# Patient Record
Sex: Female | Born: 1975 | Race: Black or African American | Hispanic: No | Marital: Single | Smoking: Never smoker
Health system: Southern US, Community
[De-identification: ages and names within clinical notes are randomized; demographics above are authoritative.]

## PROBLEM LIST (undated history)

## (undated) ENCOUNTER — Inpatient Hospital Stay (HOSPITAL_COMMUNITY): Payer: Self-pay

## (undated) DIAGNOSIS — I471 Supraventricular tachycardia, unspecified: Secondary | ICD-10-CM

## (undated) DIAGNOSIS — O24419 Gestational diabetes mellitus in pregnancy, unspecified control: Secondary | ICD-10-CM

## (undated) DIAGNOSIS — G43909 Migraine, unspecified, not intractable, without status migrainosus: Secondary | ICD-10-CM

## (undated) DIAGNOSIS — D649 Anemia, unspecified: Secondary | ICD-10-CM

---
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
## 2001-08-18 ENCOUNTER — Other Ambulatory Visit: Admission: RE | Admit: 2001-08-18 | Discharge: 2001-08-18 | Payer: Self-pay | Admitting: *Deleted

## 2007-09-16 ENCOUNTER — Ambulatory Visit (HOSPITAL_COMMUNITY): Admission: RE | Admit: 2007-09-16 | Discharge: 2007-09-16 | Payer: Self-pay | Admitting: Obstetrics & Gynecology

## 2007-09-23 ENCOUNTER — Inpatient Hospital Stay (HOSPITAL_COMMUNITY): Admission: AD | Admit: 2007-09-23 | Discharge: 2007-09-23 | Payer: Self-pay | Admitting: Obstetrics

## 2007-10-15 ENCOUNTER — Emergency Department (HOSPITAL_COMMUNITY): Admission: EM | Admit: 2007-10-15 | Discharge: 2007-10-15 | Payer: Self-pay | Admitting: Emergency Medicine

## 2007-10-28 ENCOUNTER — Ambulatory Visit (HOSPITAL_COMMUNITY): Admission: RE | Admit: 2007-10-28 | Discharge: 2007-10-28 | Payer: Self-pay | Admitting: Obstetrics & Gynecology

## 2007-11-18 ENCOUNTER — Ambulatory Visit: Payer: Self-pay | Admitting: Cardiovascular Disease

## 2007-12-02 ENCOUNTER — Ambulatory Visit: Payer: Self-pay

## 2007-12-02 ENCOUNTER — Encounter: Payer: Self-pay | Admitting: Cardiovascular Disease

## 2008-01-03 ENCOUNTER — Ambulatory Visit (HOSPITAL_COMMUNITY): Admission: RE | Admit: 2008-01-03 | Discharge: 2008-01-03 | Payer: Self-pay | Admitting: Obstetrics & Gynecology

## 2008-01-31 ENCOUNTER — Ambulatory Visit: Payer: Self-pay | Admitting: Cardiovascular Disease

## 2008-02-11 ENCOUNTER — Inpatient Hospital Stay (HOSPITAL_COMMUNITY): Admission: AD | Admit: 2008-02-11 | Discharge: 2008-02-11 | Payer: Self-pay | Admitting: Obstetrics & Gynecology

## 2008-02-25 ENCOUNTER — Inpatient Hospital Stay (HOSPITAL_COMMUNITY): Admission: RE | Admit: 2008-02-25 | Discharge: 2008-02-27 | Payer: Self-pay | Admitting: Obstetrics & Gynecology

## 2008-02-25 ENCOUNTER — Encounter: Payer: Self-pay | Admitting: Obstetrics & Gynecology

## 2008-12-05 IMAGING — US US FETAL BPP W/O NONSTRESS
1 series · 4 of 4 positions shown · non-contrast
Comparison: none

OBSTETRICAL ULTRASOUND:
 This ultrasound exam was performed in the [HOSPITAL] Ultrasound Department.  The OB US report was generated in the AS system, and faxed to the ordering physician.  This report is also available in [REDACTED] PACS.

[Series 1: us fetal bpp w/o nonstress · 4 acquisitions, 4 frames shown]
[im 1/4]
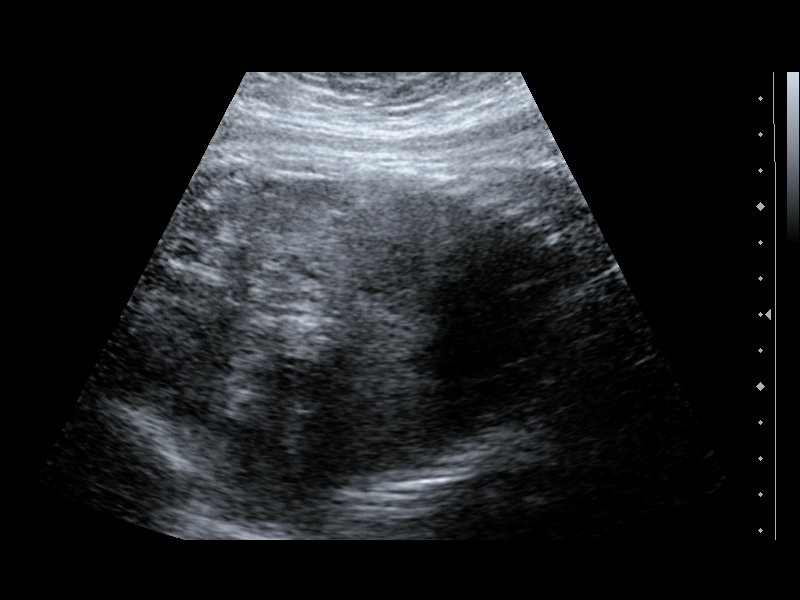
[im 2/4]
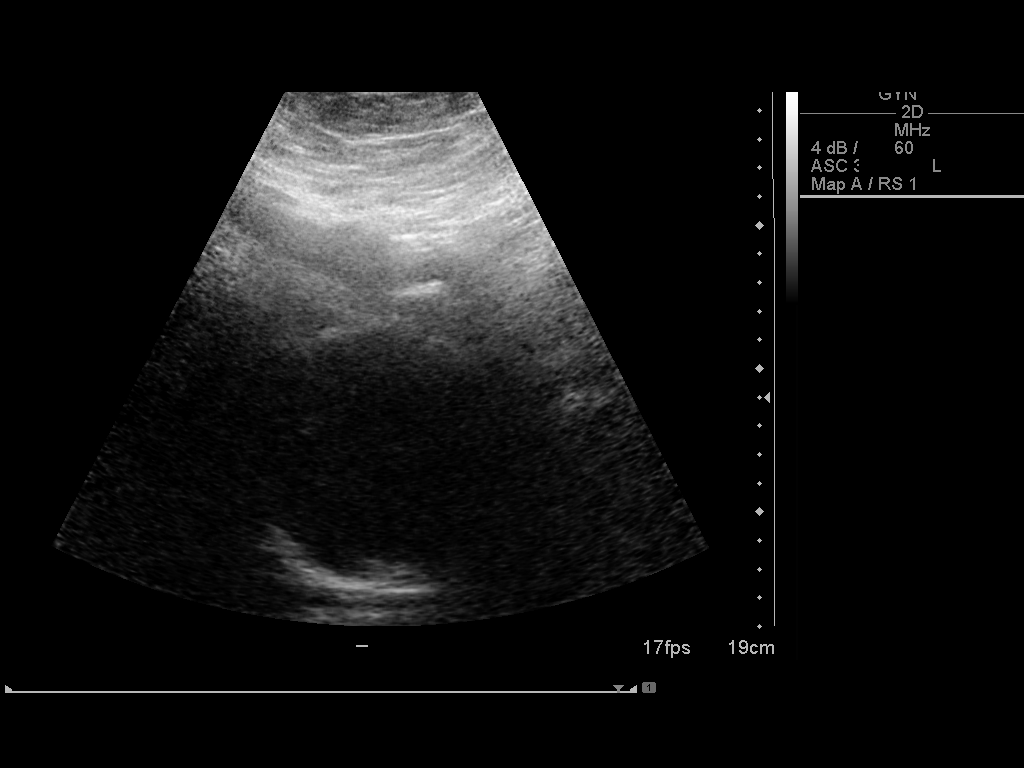
[im 3/4]
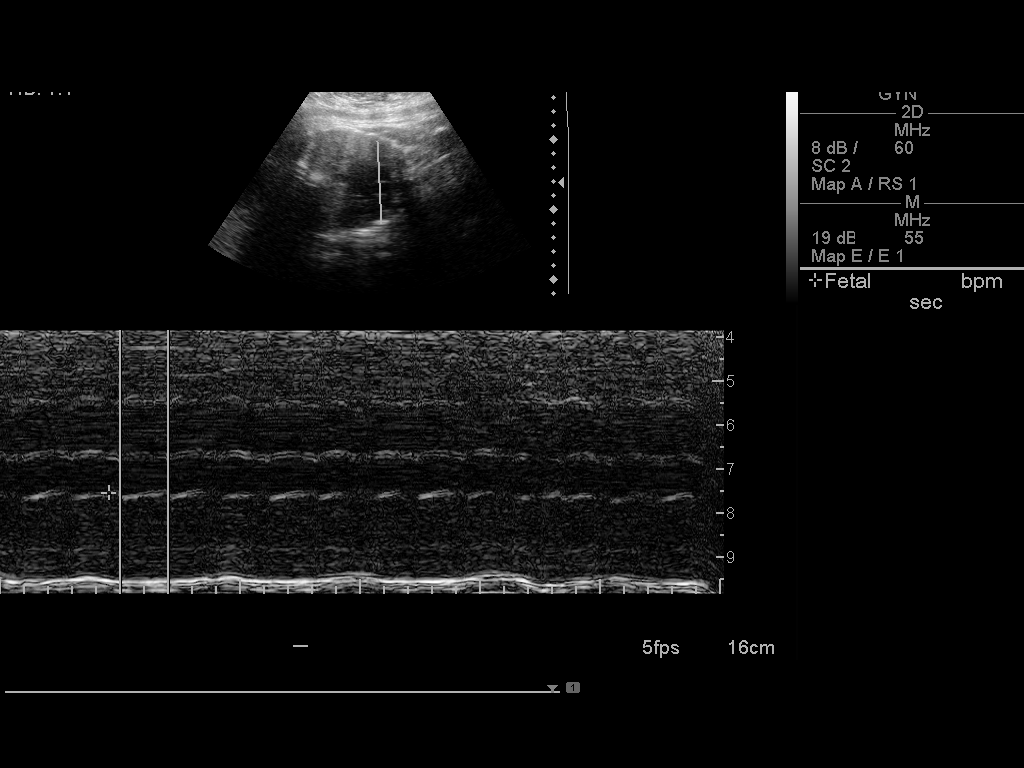
[im 4/4]
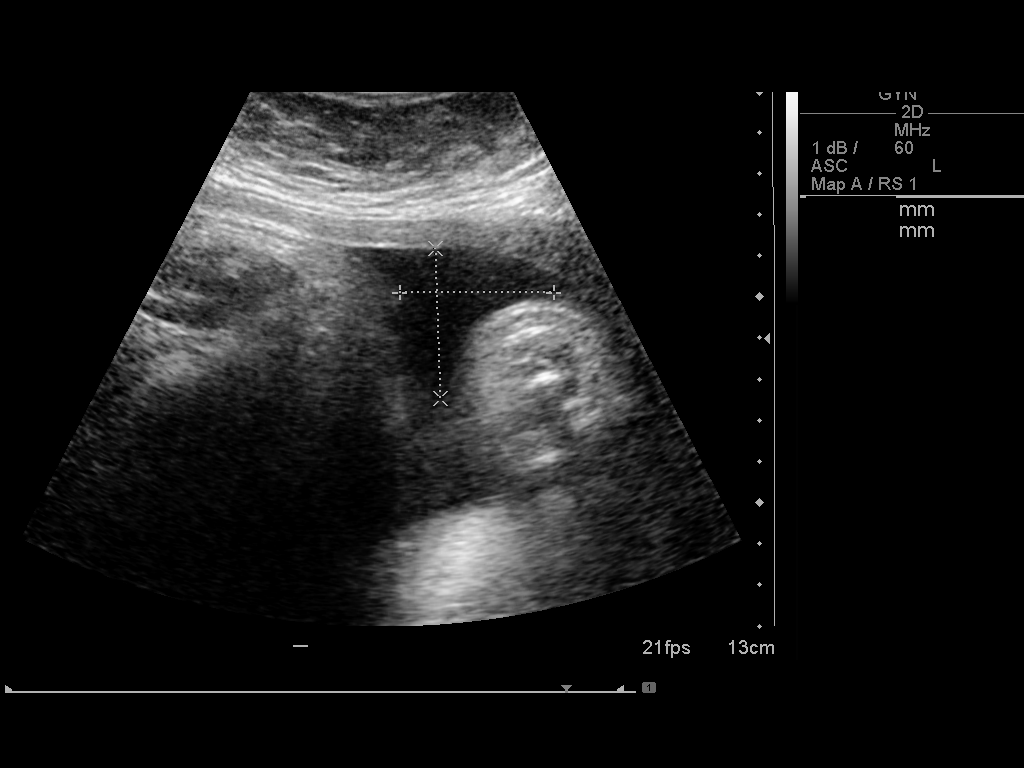

[4 of 4 positions shown; findings below may reference images not displayed]

IMPRESSION: See AS Obstetric US report.

## 2009-09-11 ENCOUNTER — Encounter: Admission: RE | Admit: 2009-09-11 | Discharge: 2009-09-11 | Payer: Self-pay | Admitting: Obstetrics & Gynecology

## 2010-09-17 NOTE — Assessment & Plan Note (Signed)
[facility]                            CARDIOLOGY OFFICE NOTE   Mary Fry, Mary Fry                    MRN:          [id number]  DATE:01/31/2008                            DOB:          [birth year]    Mary Fry returns today for followup.  She has been doing well.  There  is a questionable history of PSVT and palpitations.  She is due to have  a C-section in November and this will be her third child.  The first 2  were C-sections.  Her palpitations have resolved.  She did not have any  need for an event monitor.  Her 2-D echocardiogram was normal with an EF  of 60-65%.  No evidence cardiomyopathy.   She does not recount what her current hemoglobin or crit is but  apparently, she has significant anemia.   In talking to the patient, she feels that she is doing well.  She has  actually lost a little bit of weight during her pregnancy.  She has had  an ultrasound.  She is having a healthy baby girl and she will name the  baby [place].   Review of systems otherwise negative.  In particular, she is not having  any nausea, vomiting, palpitations, chest pain.  She has mild chronic  exertional dyspnea due to her weight.   She is only taking prenatal vitamins.   She has no known allergies.   PHYSICAL EXAMINATION:  GENERAL:  Remarkable for morbidly obese black  female who is in a good mood.  VITAL SIGNS:  Weight is 363, blood pressure is 110/70, pulse 80 and  regular, respiratory rate 14, afebrile.  HEENT:  Unremarkable.  Carotids normal without bruit.  No  lymphadenopathy, thyromegaly, or JVP elevation.  LUNGS:  Clear  diaphragmatic motion.  No wheezing.  ABDOMEN:  Protuberant and difficult to examine due to her obesity.  No  fetal motion detected.  No hepatosplenomegaly.  No hepatojugular reflux.  No tenderness.  EXTREMITIES:  Distal pulses intact.  Trace edema.  NEURO:  Nonfocal.  SKIN:  Warm and dry.  No muscular weakness.   IMPRESSION:  1.  Palpitations, benign.  No indication for beta blocker.  Continue to      monitor.  2. Dyspnea.  Functional, secondary to central obesity in a pregnancy      state.  3. History of anemia.  We will probably check this prior to delivery.  4. Gravida 3, para 3 healthy pregnancy.  Follow up with Dr. Jean Rosenthal,      elective C-section in November.  We will see her on an as-needed      basis.     Noralyn Pick. Eden Emms, MD, [facility]  Electronically Signed    PCN/MedQ  DD: 01/31/2008  DT: 02/01/2008  Job #: [id number]   cc:   Roseanna Rainbow, M.D.

## 2010-09-17 NOTE — Discharge Summary (Signed)
Mary Fry, Mary Fry           ACCOUNT NO.:  [id number]   MEDICAL RECORD NO.:  [id number]          PATIENT TYPE:  INP   LOCATION:  9146                          FACILITY:  [facility]   PHYSICIAN:  Charles A. Clearance Coots, M.D.DATE OF BIRTH:  10-20-1975   DATE OF ADMISSION:  02/25/2008  DATE OF DISCHARGE:  02/27/2008                               DISCHARGE SUMMARY   ADMITTING DIAGNOSES:  1. Multipara at term.  2. Two previous cesarean sections.  3. Morbid obesity.  4. Gestational diabetes, diet controlled.  5. Elective repeat cesarean section.   DISCHARGE DIAGNOSES:  1. Multipara at term.  2. Two previous cesarean sections.  3. Morbid obesity.  4. Gestational diabetes, diet controlled.  5. Status post elective repeat low-transverse cesarean on February 25, 2008.  Viable female delivered at 14:02, Apgars of 1 at 1 minute      and 8 at 5 minutes, weight of 3970 grams, and length of 51.44 cm.      Mother and infant discharged home in good condition.   REASON FOR ADMISSION:  A 35 year old para 2, estimated date of  confinement of March 05, 2008, presents for elective repeat cesarean  section.   PAST MEDICAL HISTORY:  Surgery - cesarean section.  Illnesses - anemia.   MEDICATIONS:  Prenatal vitamins.   ALLERGIES:  No known drug allergies.   SOCIAL HISTORY:  Single.  Negative tobacco, alcohol, or recreational  drug use.   FAMILY HISTORY:  No major illnesses listed.   PHYSICAL EXAMINATION:  GENERAL:  Morbidly obese black female, in no  acute distress.  VITAL SIGNS:  She is afebrile and blood pressure of 127/63.  LUNGS:  Clear to auscultation bilaterally.  HEART:  Regular rate and rhythm.  ABDOMEN:  Gravid and nontender.  CERVICAL:  Omitted.   ADMITTING LABS:  Hemoglobin 10, hematocrit 33, white blood cell count  8200, platelets 221,000, and RPR is nonreactive.   HOSPITAL COURSE:  The patient underwent a repeat low-transverse cesarean  section on February 25, 2008.  There  were no intraoperative  complications.  Postoperative course was uncomplicated and the patient  was discharged home on postop day #2 in good condition.   DISCHARGE LABS:  Hemoglobin 9, hematocrit 28, white blood cell count  8400, and platelets 190,000.   DISCHARGE DISPOSITION:  Medications, Percocet and ibuprofen was  prescribed for pain.  Continue prenatal vitamins.  Instructions were  given for discharge after cesarean section.  The patient is to call the  office for a followup appointment in 1 week after cesarean section for  removal of her staples.      Charles A. Clearance Coots, M.D.  Electronically Signed     CAH/MEDQ  D:  02/27/2008  T:  02/27/2008  Job:  [id number]

## 2010-09-17 NOTE — Op Note (Signed)
Mary Fry, Mary Fry           ACCOUNT NO.:  [id number]   MEDICAL RECORD NO.:  [id number]          PATIENT TYPE:  INP   LOCATION:  9146                          FACILITY:  [facility]   PHYSICIAN:  Roseanna Rainbow, M.D.DATE OF BIRTH:  1975-12-07   DATE OF PROCEDURE:  02/25/2008  DATE OF DISCHARGE:                               OPERATIVE REPORT   PREOPERATIVE DIAGNOSES:  1. Intrauterine pregnancy at 38+ weeks with a history of 2 previous      cesarean deliveries.  2. Gestational diabetes, diet-controlled.  3. Suspected large for gestational age fetus.   POSTOPERATIVE DIAGNOSES:  1. Intrauterine pregnancy at 38+ weeks with a history of 2 previous      cesarean deliveries.  2. Gestational diabetes, diet-controlled.  3. Suspected large for gestational age fetus.   PROCEDURE:  Repeat cesarean delivery via midline skin incision.   SURGEONS:  1. Roseanna Rainbow, MD  2. Kathreen Cosier, MD   ANESTHESIA:  Combined epidural and spinal.   PATHOLOGY:  Placenta.   ESTIMATED BLOOD LOSS:  600 mL.   COMPLICATIONS:  None.   PROCEDURES:  The patient was taken to the operating room.  A combined  spinal and epidural anesthetic was placed.  She was then placed in the  dorsal supine position with a leftward tilt and prepped and draped in  the usual sterile fashion.  After a time-out had been completed, a  midline supraumbilical skin incision was made with a scalpel.  Blunt  dissection was used to dissect down to the fascia.  The fascia was  incised along the length of the incision.  The parietal peritoneum was  tented up and entered.  This incision was then extended along the length  of the incision.  An Alexis retractor was then placed into the incision.  The bowel was packed away with moistened laparotomy packs.  The  vesicouterine peritoneum was tented up and entered sharply.  This  incision was then extended bilaterally and the bladder flap was created  using both sharp and  blunt dissection.  The lower uterine segment was  then incised in a transverse fashion with a scalpel.  This incision was  then extended with bandage scissors.  The infant's head was delivered  atraumatically.  The oropharynx was suctioned with a bulb suction.  The  cord was clamped and cut.  The infant was handed off to the awaiting  neonatologist.  The placenta was then removed.  The intrauterine cavity  was evacuated of any remaining amniotic fluid, clots, and debris with  moist laparotomy sponge.  The uterine incision was then reapproximated  in a running interlocking fashion using suture of 0 Monocryl.  A second  imbricating layer of the same suture was then placed.  Adequate  hemostasis was noted.  The paracolic gutters were then copiously  irrigated.  The packs and retractor were then removed from the incision.  The parietal peritoneum and fascia were then closed as a single layer  using running sutures of #1 PDS.  This subcuticular  layer was reapproximated with a running suture of 2-0 plain.  The skin  was closed with staples.  At the close of the procedure, the instrument  and pack counts were said to be correct x2.  The patient was taken to  the PACU awake and in stable condition.      Roseanna Rainbow, M.D.  Electronically Signed     LAJ/MEDQ  D:  02/25/2008  T:  02/26/2008  Job:  [id number]

## 2010-09-17 NOTE — H&P (Signed)
Mary Fry, HAHNE           ACCOUNT NO.:  [id number]   MEDICAL RECORD NO.:  [id number]          PATIENT TYPE:  INP   LOCATION:                                [facility]  [facility]   PHYSICIAN:  Roseanna Rainbow, M.D.DATE OF BIRTH:  1975-09-12   DATE OF ADMISSION:  02/25/2008  DATE OF DISCHARGE:  02/27/2008                              HISTORY & PHYSICAL   CHIEF COMPLAINT:  The patient is a 35 year old para 2 with an expected  date of confinement of November 1 with an intrauterine pregnancy at 38  weeks 5 days for an elective repeat cesarean delivery.   HISTORY OF PRESENT ILLNESS:  Please see the above.   ALLERGIES:  No known drug allergies.   MEDICATIONS:  Please see the medication reconciliation form.   OB RISK FACTORS:  Morbid obesity.  She has a history of previous  cesarean delivery.  There is a history of gestational diabetes, diet-  controlled.  There is a recent ultrasound with suspected macrosomia and  there were several episodes of tachycardia for which she had seen a  cardiologist, that has resolved.   PAST OBSTETRICAL HISTORY:  In August of 1997, she was delivered of a  live-born female, 8 pounds 1 ounce, full-term, via cesarean delivery.  In  January of 2002, she was delivered of a live-born female, 6 pounds 5  ounces, via repeat cesarean delivery.   PRENATAL LABS:  Blood type O positive, antibody screen negative.  Wound  culture, September 2, positive for Proteus.  Chlamydia probe negative.  Free T4, June 18, normal.  One-hour GCT 146.  Gardnerella vaginalis  positive, May 2009.  Hepatitis B surface antigen negative.  Hematocrit  31.1, hemoglobin 9.7, HIV nonreactive.  Platelets 221,000.  Rubella  immune.  Sickle cell negative.  TSH normal.  Trichomonas vaginalis  positive, May 2009.  Varicella immune.   An  ultrasound on August 31:  There is posterior placenta, normal  amniotic fluid index, estimated fetal weight percentile at greater than  the 90th  percentile.   PAST GYN HISTORY:  Noncontributory.   PAST MEDICAL HISTORY:  Iron deficiency anemia.   PAST SURGICAL HISTORY:  Please see the above.   SOCIAL HISTORY:  She is unemployed, single, does not give any  significant history of alcohol usage.  Has no significant smoking  history.  Denies illicit drug use.   FAMILY HISTORY:  Noncontributory.   PHYSICAL EXAM:  VITAL SIGNS:  Weight 364 pounds, blood pressure 127/63,  fetal heart tracing reassuring on nonstress test.  CBGs in range.  GENERAL:  Obese African American female in no apparent distress.  ABDOMEN:  Gravid, nontender.  PELVIC EXAM:  Deferred.  EXTREMITIES:  Trace to 1+ lower extremity edema.   ASSESSMENT:  Multipara at term with a history of two previous cesarean  deliveries, morbidly obese.  Gestational diabetes, diet-controlled.  Suspected macrosomic fetus.   PLAN:  Admission.  Elective repeat cesarean delivery.      Roseanna Rainbow, M.D.  Electronically Signed     LAJ/MEDQ  D:  02/25/2008  T:  02/25/2008  Job:  [id number]

## 2010-09-17 NOTE — Assessment & Plan Note (Signed)
[facility]                            CARDIOLOGY OFFICE NOTE   Mary Fry, Mary Fry                    MRN:          [id number]  DATE:11/18/2007                            DOB:          1975/07/01    Mary Fry is a 35 year old patient referred by her OB/GYN doctor for  palpitations, question of PSVT.  The patient has had about 5 episodes in  her life where she has had rapid palpitations.  They sound like possible  PSVT with sudden onset of rapid palpitations.  She was seen in the  emergency room on October 15, 2007.   There was no documented SVT, but apparently her heart rate jumped up  into the 160 range.  She generally feels relief with slow breathing.   She can get associated dyspnea with this.  There was no presyncope or  diaphoresis.   She had a couple of short episodes after her ER visit, but has not had  any since then.   The patient has not had previous history of cardiomyopathy or heart  problems.  There has been no history of rheumatic disease, coronary  artery disease, or congenital heart disease.   She has 2 healthy children that I believe are 7 and 11.  She is  currently 13 weeks' pregnant with a due date in November.   She has significant central obesity and has always been heavy.   The patient is currently doing well with her pregnancy.  She is having a  baby girl and as far as she knows there is no other problems.   Her review of systems is otherwise negative.   Her past medical history is remarkable for 2 previous C-sections.  There  is a history of an anemia.  It appears that her hematocrit was as low as  31 on her ER visit.  She tells me her thyroid has recently been tested  and is normal.   The patient is single.  She has 2 living children and a third on the  way.  She does not smoke or drink.  She denies drugs.  She is divorced.   She is otherwise fairly sedentary and unemployed.   Her family history is  unremarkable.  Mother and father are still alive.   Her meds only include prenatal vitamins.  She has no known allergies.   PHYSICAL EXAMINATION:  GENERAL:  Her exam is remarkable for a morbidly  obese black female who is pregnant.  Affect is appropriate.  VITAL SIGNS:  Weight is 367.  Current heart rate is 90, blood pressure  is 130/80, afebrile, and respiratory rate 16.  HEENT:  Unremarkable.  Carotids normal without bruits.  No  lymphadenopathy, thyromegaly, or JVP elevation.  LUNGS:  Clear, good diaphragmatic motion.  No wheezing.  HEART:  S1, S2.  PMI not palpable.  ABDOMEN:  Protuberant consistent with gestational age and central  obesity.  No tenderness.  No hepatosplenomegaly.  No hepatojugular  reflux.  No bruits.  EXTREMITIES:  Distal pulses are intact.  Trace edema.  NEURO:  Nonfocal.  SKIN:  Warn and dry.  No muscular weakness.   EKG is normal.   IMPRESSION:  1. The patient may have occasional paroxysmal supraventricular      tachycardia.  I talked to her at length about the options.  We      prefer not to start pregnant women on beta-blockers for the      duration of the pregnancy due to the possible effect on fetal heart      rate and also possible hypoglycemia at the time of delivery.  She      is just as likely not to have any recurrent episodes.  I told her,      if she were, to call us, and we will give her an event monitor and      possibly p.r.n. Inderal to take as a short-acting agent.  She will      have a 2-D echocardiogram to further assess her heart and make sure      that there is no structural abnormalities or cardiomyopathy.  2. Anemia.  Follow with Dr. Jean Rosenthal.  I suspect this also increases      her adrenergic tone and tends to make her run more tachycardic.      Consider addition of iron.  3. Central obesity.  Again, the combination of central obesity and      anemia in the state of pregnancy will have a run relatively high      heart rate.   Clearly, the patient needs to do something about her      weight after her delivery.  For the time being, her weight will be      monitored closely during her pregnancy to try to avoid excessive      weight gain.   As long as the patient's echo shows normal RV and LV function, I will  probably see her back in September prior to her due date.  She will call  us, if she has any more palpitations to get an event monitor.     Noralyn Pick. Eden Emms, MD, [facility]  Electronically Signed    PCN/MedQ  DD: 11/18/2007  DT: 11/19/2007  Job #: [id number]   cc:   Roseanna Rainbow, M.D.

## 2011-01-03 ENCOUNTER — Ambulatory Visit (HOSPITAL_COMMUNITY)
Admission: RE | Admit: 2011-01-03 | Discharge: 2011-01-03 | Disposition: A | Discharge status: Home / Self Care | Payer: Medicaid Other | Source: Ambulatory Visit | Attending: Obstetrics & Gynecology | Admitting: Obstetrics & Gynecology

## 2011-01-03 ENCOUNTER — Other Ambulatory Visit: Payer: Self-pay | Admitting: Obstetrics & Gynecology

## 2011-01-03 ENCOUNTER — Encounter (HOSPITAL_COMMUNITY): Payer: Self-pay

## 2011-01-03 VITALS — BP 138/83 | HR 82 | Wt 363.0 lb

## 2011-01-03 DIAGNOSIS — Z3682 Encounter for antenatal screening for nuchal translucency: Secondary | ICD-10-CM

## 2011-01-03 DIAGNOSIS — O99210 Obesity complicating pregnancy, unspecified trimester: Secondary | ICD-10-CM | POA: Insufficient documentation

## 2011-01-03 DIAGNOSIS — O34219 Maternal care for unspecified type scar from previous cesarean delivery: Secondary | ICD-10-CM | POA: Insufficient documentation

## 2011-01-03 DIAGNOSIS — E669 Obesity, unspecified: Secondary | ICD-10-CM | POA: Insufficient documentation

## 2011-01-03 DIAGNOSIS — O09529 Supervision of elderly multigravida, unspecified trimester: Secondary | ICD-10-CM | POA: Insufficient documentation

## 2011-01-03 NOTE — Progress Notes (Signed)
Genetic Counseling  High-Risk Gestation Note  Appointment Date:  01/03/2011 Referred By: Roseanna Rainbow, * Date of Birth:  Aug 29, 1975  Pregnancy History: [id number] Estimated Date of Delivery: 07/10/11 Estimated Gestational Age: [redacted]w[redacted]d  I met with [name] for prenatal genetic counseling given advanced maternal age.   She was counseled regarding maternal age and the association with risk for chromosome conditions due to nondisjunction with aging of the ova.   We reviewed chromosomes, nondisjunction, and the associated 1 in 84 risk for fetal aneuploidy in the first trimester related to a maternal age of 76 at delivery.  She was counseled that the risk for aneuploidy decreases as gestational age increases, accounting for those pregnancies which spontaneously abort.  We specifically discussed Down syndrome (trisomy 82), trisomies 57 and 66, and sex chromosome aneuploidies (47,XXX and 47,XXY) including the common features and prognoses of each.    We reviewed available screening and diagnostic options.  Regarding screening tests, we discussed the options of First screen, Integrated screen and ultrasound.  She understands that screening tests are used to modify a patient's a priori risk for aneuploidy, typically based on age.  This estimate provides a pregnancy specific risk assessment.  We also reviewed the availability of diagnostic options including CVS and amniocentesis. A risk of 1 in 100 was given for CVS and 1 in 200-300 was given for amniocentesis, the primary complication being spontaneous pregnancy loss.  It was discussed that not all birth defects can be identified with these procedures. We discussed the risks, limitations, and benefits of each.  After reviewing these options, Mary Fry elected to have First Screen. However, a nuchal translucency measurement was unable to be obtained today on ultrasound. Given this and given the patient's gestational age, she elected to pursue serum  integrated screening after further consideration. She will return in the second trimester for a second blood draw. She declined CVS and amniocentesis at this time.  She wishes to pursue these options to help ascertain her pregnancy specific risks for aneuploidy. She understands that ultrasound and integrated screen cannot rule out all birth defects or genetic syndromes.  [However, she was counseled that 50-80% of fetuses with Down syndrome and up to 90% of fetuses with trisomies 7 and 5, when well visualized, have detectable anomalies or soft markers by ultrasound.]   Both family histories were reviewed and found to be contributory  for the father of the pregnancy with epilepsy.  The cause is not known. Epilepsy occurs in 1% of the population and can have many causes.  Approximately 80% of epilepsy is thought to be idiopathic while the remaining 20% is secondary to a variety of factors such as perinatal events, infections, trauma and genetic disease.  A specific diagnosis in an affected individual is necessary to accurately assess the risk for other family members to develop epilepsy.  In the absence of a known etiology, epilepsy is thought to be caused by a combination of genetic and environmental factors, called multifactorial inheritance. Recurrence risk for idiopathic epilepsy for offspring of an affected individual is approximately 4%. Without further information regarding the provided family history, an accurate genetic risk cannot be calculated.   Further genetic counseling is warranted if more information is obtained.  She denied exposure to environmental toxins or chemical agents.  She denied the use of alcohol, tobacco or street drugs.  She denied significant viral illnesses during the course of her pregnancy.  Her medical and surgical history were noncontributory.   A complete  obstetrical ultrasound was performed at the time of today's evaluation.  The ultrasound report is reported separately.     We counseled the patient for approximately 30 minutes regarding the above risks.     Mary Braun Commodore Bellew, MS, [facility] 01/03/2011

## 2011-01-03 NOTE — Progress Notes (Signed)
Ultrasound in AS/OBGYN/EPIC.  Follow up U/S scheduled 

## 2011-01-29 LAB — COMPREHENSIVE METABOLIC PANEL
ALT: 24
AST: 40 — ABNORMAL HIGH
Albumin: 2.9 — ABNORMAL LOW
BUN: 4 — ABNORMAL LOW
GFR calc Af Amer: >60
Potassium: 3.8
Total Protein: 6.6

## 2011-01-29 LAB — URINALYSIS, ROUTINE W REFLEX MICROSCOPIC
Bilirubin Urine: NEGATIVE
Nitrite: NEGATIVE
Urobilinogen, UA: 0.2
pH: 8.5 — ABNORMAL HIGH

## 2011-01-29 LAB — CBC
MCHC: 32.8
Platelets: 228
RBC: 4.19
WBC: 8.7

## 2011-01-29 LAB — URINE MICROSCOPIC-ADD ON

## 2011-01-29 LAB — URINE CULTURE

## 2011-01-30 LAB — DIFFERENTIAL
Basophils Relative: 0
Eosinophils Absolute: 0.2
Eosinophils Relative: 2
Lymphocytes Relative: 21
Monocytes Absolute: 0.4
Monocytes Relative: 4
Neutro Abs: 7.2
Neutrophils Relative %: 73

## 2011-01-30 LAB — RAPID URINE DRUG SCREEN, HOSP PERFORMED
Amphetamines: NOT DETECTED
Barbiturates: NOT DETECTED
Tetrahydrocannabinol: NOT DETECTED

## 2011-01-30 LAB — POCT I-STAT, CHEM 8
BUN: 5 — ABNORMAL LOW
Calcium, Ion: 1.23
Creatinine, Ser: 0.7
Potassium: 3.7

## 2011-01-30 LAB — URINALYSIS, ROUTINE W REFLEX MICROSCOPIC
Bilirubin Urine: NEGATIVE
Ketones, ur: NEGATIVE
Nitrite: NEGATIVE
Specific Gravity, Urine: 1.011
Urobilinogen, UA: 0.2

## 2011-01-30 LAB — CBC
Platelets: 232
RBC: 4.2

## 2011-01-30 LAB — POCT CARDIAC MARKERS
Operator id: 161631
Troponin i, poc: <0.05

## 2011-02-03 LAB — BASIC METABOLIC PANEL
CO2: 20
Calcium: 9.2
Glucose, Bld: 96

## 2011-02-03 LAB — CBC
HCT: 28.2 — ABNORMAL LOW
HCT: 33.8 — ABNORMAL LOW
Hemoglobin: 9.1 — ABNORMAL LOW
MCHC: 32.2
Platelets: 221
RBC: 3.7 — ABNORMAL LOW
RDW: 15.4
WBC: 8.2

## 2011-02-03 LAB — TYPE AND SCREEN
ABO/RH(D): O POS
Antibody Screen: NEGATIVE

## 2011-02-03 LAB — ABO/RH: ABO/RH(D): O POS

## 2011-02-05 ENCOUNTER — Ambulatory Visit (HOSPITAL_COMMUNITY)
Admission: RE | Admit: 2011-02-05 | Discharge: 2011-02-05 | Disposition: A | Discharge status: Home / Self Care | Payer: Medicaid Other | Source: Ambulatory Visit | Attending: Obstetrics | Admitting: Obstetrics

## 2011-02-05 ENCOUNTER — Ambulatory Visit (HOSPITAL_COMMUNITY)
Admission: RE | Admit: 2011-02-05 | Discharge: 2011-02-05 | Disposition: A | Discharge status: Home / Self Care | Payer: Medicaid Other | Source: Ambulatory Visit | Attending: Obstetrics & Gynecology | Admitting: Obstetrics & Gynecology

## 2011-02-05 VITALS — BP 119/80 | HR 113 | Wt 372.0 lb

## 2011-02-05 DIAGNOSIS — O358XX0 Maternal care for other (suspected) fetal abnormality and damage, not applicable or unspecified: Secondary | ICD-10-CM | POA: Insufficient documentation

## 2011-02-05 DIAGNOSIS — O99210 Obesity complicating pregnancy, unspecified trimester: Secondary | ICD-10-CM | POA: Insufficient documentation

## 2011-02-05 DIAGNOSIS — O09529 Supervision of elderly multigravida, unspecified trimester: Secondary | ICD-10-CM | POA: Insufficient documentation

## 2011-02-05 DIAGNOSIS — O34219 Maternal care for unspecified type scar from previous cesarean delivery: Secondary | ICD-10-CM | POA: Insufficient documentation

## 2011-02-05 DIAGNOSIS — Z363 Encounter for antenatal screening for malformations: Secondary | ICD-10-CM | POA: Insufficient documentation

## 2011-02-05 DIAGNOSIS — E669 Obesity, unspecified: Secondary | ICD-10-CM | POA: Insufficient documentation

## 2011-02-05 DIAGNOSIS — Z3682 Encounter for antenatal screening for nuchal translucency: Secondary | ICD-10-CM

## 2011-02-05 DIAGNOSIS — Z1389 Encounter for screening for other disorder: Secondary | ICD-10-CM | POA: Insufficient documentation

## 2011-02-05 NOTE — Progress Notes (Signed)
Ultrasound in AS/OBGYN/EPIC.  Follow up U/S scheduled 

## 2011-02-07 ENCOUNTER — Other Ambulatory Visit: Payer: Self-pay | Admitting: Obstetrics and Gynecology

## 2011-02-10 ENCOUNTER — Telehealth (HOSPITAL_COMMUNITY): Payer: Self-pay | Admitting: MS"

## 2011-02-10 NOTE — Telephone Encounter (Signed)
Serum Integrated screen increased risk for Down syndrome from patient's age related risk of 1 in 160 to 1 in 39 (3.8%). Discussed these results with patient including 3.8% chance that Down syndrome is in pregnancy and >96% chance that baby does not have Down syndrome according to these results. Reviewed screening option of targeted ultrasound. Patient has follow-up appointment on 10/24. Also reviewed diagnostic option of amniocentesis including risks, benefits, and limitations. Patient stated that she is leaning towards amniocentesis but needs to talk it over further with the father of the pregnancy. She will call us back should she elect to proceed with amniocentesis.

## 2011-02-26 ENCOUNTER — Ambulatory Visit (HOSPITAL_COMMUNITY)
Admission: RE | Admit: 2011-02-26 | Discharge: 2011-02-26 | Disposition: A | Discharge status: Home / Self Care | Payer: Medicaid Other | Source: Ambulatory Visit | Attending: Obstetrics & Gynecology | Admitting: Obstetrics & Gynecology

## 2011-02-26 DIAGNOSIS — O09529 Supervision of elderly multigravida, unspecified trimester: Secondary | ICD-10-CM | POA: Insufficient documentation

## 2011-02-26 DIAGNOSIS — Z3682 Encounter for antenatal screening for nuchal translucency: Secondary | ICD-10-CM

## 2011-02-26 DIAGNOSIS — E669 Obesity, unspecified: Secondary | ICD-10-CM | POA: Insufficient documentation

## 2011-02-26 DIAGNOSIS — O34219 Maternal care for unspecified type scar from previous cesarean delivery: Secondary | ICD-10-CM | POA: Insufficient documentation

## 2011-02-26 DIAGNOSIS — O99210 Obesity complicating pregnancy, unspecified trimester: Secondary | ICD-10-CM | POA: Insufficient documentation

## 2011-02-26 NOTE — Progress Notes (Signed)
See ultrasound report in AS  

## 2011-04-02 LAB — RPR: RPR: NONREACTIVE

## 2011-04-09 ENCOUNTER — Ambulatory Visit (HOSPITAL_COMMUNITY)
Admission: RE | Admit: 2011-04-09 | Discharge: 2011-04-09 | Disposition: A | Discharge status: Home / Self Care | Payer: Medicaid Other | Source: Ambulatory Visit | Attending: Obstetrics & Gynecology | Admitting: Obstetrics & Gynecology

## 2011-04-09 DIAGNOSIS — O09529 Supervision of elderly multigravida, unspecified trimester: Secondary | ICD-10-CM | POA: Insufficient documentation

## 2011-04-09 DIAGNOSIS — O34219 Maternal care for unspecified type scar from previous cesarean delivery: Secondary | ICD-10-CM | POA: Insufficient documentation

## 2011-04-09 DIAGNOSIS — Z3682 Encounter for antenatal screening for nuchal translucency: Secondary | ICD-10-CM

## 2011-04-09 DIAGNOSIS — E669 Obesity, unspecified: Secondary | ICD-10-CM | POA: Insufficient documentation

## 2011-04-09 DIAGNOSIS — O99210 Obesity complicating pregnancy, unspecified trimester: Secondary | ICD-10-CM | POA: Insufficient documentation

## 2011-05-02 ENCOUNTER — Other Ambulatory Visit: Payer: Self-pay | Admitting: Obstetrics & Gynecology

## 2011-05-02 DIAGNOSIS — O09529 Supervision of elderly multigravida, unspecified trimester: Secondary | ICD-10-CM

## 2011-05-02 DIAGNOSIS — O34219 Maternal care for unspecified type scar from previous cesarean delivery: Secondary | ICD-10-CM

## 2011-05-09 ENCOUNTER — Ambulatory Visit (HOSPITAL_COMMUNITY)
Admission: RE | Admit: 2011-05-09 | Discharge: 2011-05-09 | Disposition: A | Discharge status: Home / Self Care | Payer: Medicaid Other | Source: Ambulatory Visit | Attending: Obstetrics & Gynecology | Admitting: Obstetrics & Gynecology

## 2011-05-09 DIAGNOSIS — E669 Obesity, unspecified: Secondary | ICD-10-CM | POA: Insufficient documentation

## 2011-05-09 DIAGNOSIS — O09529 Supervision of elderly multigravida, unspecified trimester: Secondary | ICD-10-CM | POA: Insufficient documentation

## 2011-05-09 DIAGNOSIS — O34219 Maternal care for unspecified type scar from previous cesarean delivery: Secondary | ICD-10-CM

## 2011-05-21 ENCOUNTER — Other Ambulatory Visit: Payer: Self-pay | Admitting: Obstetrics & Gynecology

## 2011-05-23 ENCOUNTER — Other Ambulatory Visit: Payer: Self-pay | Admitting: Obstetrics & Gynecology

## 2011-05-23 ENCOUNTER — Ambulatory Visit (HOSPITAL_COMMUNITY)
Admission: RE | Admit: 2011-05-23 | Discharge: 2011-05-23 | Disposition: A | Discharge status: Home / Self Care | Payer: Medicaid Other | Source: Ambulatory Visit | Attending: Obstetrics & Gynecology | Admitting: Obstetrics & Gynecology

## 2011-05-23 DIAGNOSIS — O09529 Supervision of elderly multigravida, unspecified trimester: Secondary | ICD-10-CM | POA: Insufficient documentation

## 2011-05-23 DIAGNOSIS — O288 Other abnormal findings on antenatal screening of mother: Secondary | ICD-10-CM

## 2011-05-23 DIAGNOSIS — O34219 Maternal care for unspecified type scar from previous cesarean delivery: Secondary | ICD-10-CM | POA: Insufficient documentation

## 2011-05-23 DIAGNOSIS — E669 Obesity, unspecified: Secondary | ICD-10-CM | POA: Insufficient documentation

## 2011-05-27 ENCOUNTER — Encounter (HOSPITAL_COMMUNITY): Payer: Self-pay | Admitting: *Deleted

## 2011-05-27 ENCOUNTER — Other Ambulatory Visit: Payer: Self-pay

## 2011-05-27 ENCOUNTER — Inpatient Hospital Stay (HOSPITAL_COMMUNITY)
Admission: EM | Admit: 2011-05-27 | Discharge: 2011-05-28 | DRG: 781 | Disposition: A | Discharge status: Home / Self Care | Payer: Medicaid Other | Attending: Obstetrics | Admitting: Obstetrics

## 2011-05-27 DIAGNOSIS — O4700 False labor before 37 completed weeks of gestation, unspecified trimester: Secondary | ICD-10-CM | POA: Diagnosis present

## 2011-05-27 DIAGNOSIS — I471 Supraventricular tachycardia, unspecified: Secondary | ICD-10-CM | POA: Diagnosis present

## 2011-05-27 DIAGNOSIS — O99891 Other specified diseases and conditions complicating pregnancy: Principal | ICD-10-CM | POA: Diagnosis present

## 2011-05-27 DIAGNOSIS — R002 Palpitations: Secondary | ICD-10-CM | POA: Diagnosis present

## 2011-05-27 HISTORY — DX: Anemia, unspecified: D64.9

## 2011-05-27 HISTORY — DX: Supraventricular tachycardia: I47.1

## 2011-05-27 HISTORY — DX: Supraventricular tachycardia, unspecified: I47.10

## 2011-05-27 HISTORY — DX: Migraine, unspecified, not intractable, without status migrainosus: G43.909

## 2011-05-27 LAB — DIFFERENTIAL
Eosinophils Absolute: 0.1 10*3/uL (ref 0.0–0.7)
Eosinophils Relative: 1 % (ref 0–5)
Lymphocytes Relative: 24 % (ref 12–46)
Lymphs Abs: 2.5 10*3/uL (ref 0.7–4.0)
Monocytes Absolute: 0.7 10*3/uL (ref 0.1–1.0)

## 2011-05-27 LAB — URINE MICROSCOPIC-ADD ON

## 2011-05-27 LAB — URINALYSIS, ROUTINE W REFLEX MICROSCOPIC
Bilirubin Urine: NEGATIVE
Specific Gravity, Urine: 1.002 — ABNORMAL LOW (ref 1.005–1.030)
Urobilinogen, UA: 0.2 mg/dL (ref 0.0–1.0)

## 2011-05-27 LAB — BASIC METABOLIC PANEL
BUN: 4 mg/dL — ABNORMAL LOW (ref 6–23)
Calcium: 9.7 mg/dL (ref 8.4–10.5)
GFR calc non Af Amer: >90 mL/min (ref >90)
Glucose, Bld: 146 mg/dL — ABNORMAL HIGH (ref 70–99)
Potassium: 3.9 mEq/L (ref 3.5–5.1)

## 2011-05-27 LAB — POCT I-STAT TROPONIN I

## 2011-05-27 LAB — CBC
HCT: 28.4 % — ABNORMAL LOW (ref 36.0–46.0)
MCH: 22.6 pg — ABNORMAL LOW (ref 26.0–34.0)
MCV: 74.7 fL — ABNORMAL LOW (ref 78.0–100.0)
Platelets: 222 10*3/uL (ref 150–400)
RBC: 3.8 MIL/uL — ABNORMAL LOW (ref 3.87–5.11)
RDW: 16.1 % — ABNORMAL HIGH (ref 11.5–15.5)
WBC: 10.4 10*3/uL (ref 4.0–10.5)

## 2011-05-27 MED ORDER — CALCIUM CARBONATE ANTACID 500 MG PO CHEW: 2.0000 | CHEWABLE_TABLET | ORAL | Status: DC | PRN

## 2011-05-27 MED ORDER — DOCUSATE SODIUM 100 MG PO CAPS
100.0000 mg | ORAL_CAPSULE | Freq: Every day | ORAL | Status: DC
  Administered 2011-05-27: 100 mg via ORAL
  Filled 2011-05-27 (×2): qty 1

## 2011-05-27 MED ORDER — ADENOSINE 6 MG/2ML IV SOLN
INTRAVENOUS | Status: AC
  Filled 2011-05-27: qty 6

## 2011-05-27 MED ORDER — ADENOSINE 6 MG/2ML IV SOLN
6.0000 mg | Freq: Once | INTRAVENOUS | Status: AC
  Administered 2011-05-27: 6 mg via INTRAVENOUS

## 2011-05-27 MED ORDER — PRENATAL MULTIVITAMIN CH
1.0000 | ORAL_TABLET | Freq: Every day | ORAL | Status: DC
  Administered 2011-05-27: 1 via ORAL
  Filled 2011-05-27: qty 1

## 2011-05-27 MED ORDER — MAGNESIUM SULFATE 40 G IN LACTATED RINGERS - SIMPLE
2.0000 g/h | INTRAVENOUS | Status: DC
  Filled 2011-05-27: qty 500

## 2011-05-27 MED ORDER — MAGNESIUM SULFATE BOLUS VIA INFUSION
4.0000 g | Freq: Once | INTRAVENOUS | Status: AC
  Administered 2011-05-27: 4 g via INTRAVENOUS
  Filled 2011-05-27: qty 500

## 2011-05-27 MED ORDER — LACTATED RINGERS IV SOLN
INTRAVENOUS | Status: DC
  Administered 2011-05-27: 09:00:00 via INTRAVENOUS

## 2011-05-27 MED ORDER — ZOLPIDEM TARTRATE 10 MG PO TABS
10.0000 mg | ORAL_TABLET | Freq: Every evening | ORAL | Status: DC | PRN
  Administered 2011-05-27: 10 mg via ORAL
  Filled 2011-05-27: qty 1

## 2011-05-27 MED ORDER — ONDANSETRON HCL 4 MG/2ML IJ SOLN
INTRAMUSCULAR | Status: AC
  Administered 2011-05-27: 4 mg
  Filled 2011-05-27: qty 2

## 2011-05-27 MED ORDER — ACETAMINOPHEN 325 MG PO TABS
650.0000 mg | ORAL_TABLET | ORAL | Status: DC | PRN
  Administered 2011-05-27 (×2): 650 mg via ORAL
  Filled 2011-05-27 (×2): qty 2

## 2011-05-27 NOTE — Progress Notes (Signed)
Pt removed from EFM/toco for transport to [facility] AICU 371 via care Link.  FHT 155, moderate variability, 10 x 10 accels.  UCs q 5 min, mild upon palpation with adequate resting tone.  VSS.  Report called to AICU at [facility].  Report given to [facility] at [facility].  Pt in stable condition.

## 2011-05-27 NOTE — Progress Notes (Signed)
Patient ID: Mary Fry, female   DOB: [birth year], 36 y.o.   MRN: [id number] S: Preterm labor symptoms: UC's  O: Blood pressure 125/72, pulse 100, temperature 98.4 F (36.9 C), temperature source Oral, resp. rate 20, height 5\' 8"  (1.727 m), weight 170.099 kg (375 lb), last menstrual period 10/14/2010, SpO2 100.00%.   WUJ:WJXBJYNW: 150 bpm Toco: irregular, mild UC's GNF:AOZHYQMV:  (unable to assess d/t posterior nature) Effacement (%): 50 Cervical Position: Posterior Station: Ballotable Presentation: Undeterminable Exam by:: Winifred Olive  A/P- 36 y.o. admitted with chest palpitations and UC's. Preterm labor management: bedrest advised and Magnesium Sulfate tocolysis initiated.  Adenosine cardioversion done by Cardiology for SVT at [facility] ER. Dating:  [redacted]w[redacted]d PNL Needed:  none FWB:  Good  PTL:  stable

## 2011-05-27 NOTE — ED Notes (Signed)
Woke up about 4 this AM with her heart racing and SOB.  BP 135/90 P 150-160.  Denies Pain discomfort in chest due to the heart rate and increased pain with exertion

## 2011-05-27 NOTE — H&P (Signed)
Mary Fry is a 36 y.o. female presenting for chest discomfort and UC's.. Maternal Medical History:  Reason for admission: Reason for admission: contractions.  Contractions: Onset was 3-5 hours ago.   Frequency: regular.   Duration is approximately 5 minutes.   Perceived severity is mild.    Fetal activity: Perceived fetal activity is normal.   Last perceived fetal movement was within the past hour.    Prenatal complications: no prenatal complications Polyhydramnios: chest discomfort.     OB History    Grav Para Term Preterm Abortions TAB SAB Ect Mult Living   4 3 3  0 0 0 0 0 0 3     Past Medical History  Diagnosis Date  . PSVT (paroxysmal supraventricular tachycardia)   . Anemia   . Migraines   . Gestational diabetes     with third pregnancy  . SVT (supraventricular tachycardia)     Adenosine caroversion 05/27/11, previous episode 2010   Past Surgical History  Procedure Date  . Cesarean section     x 3 at term  . Cystectomy 1990's    bilateral underarms   Family History: family history is not on file. Social History:  reports that she has never smoked. She does not have any smokeless tobacco history on file. She reports that she does not drink alcohol or use illicit drugs.  Review of Systems  Cardiovascular: Positive for palpitations.  All other systems reviewed and are negative.    Dilation: Fingertip Effacement (%): 50 Station: -3 Exam by:: Alla German RN Blood pressure 111/70, pulse 118, temperature 98.8 F (37.1 C), temperature source Oral, resp. rate 24, height 5\' 8"  (1.727 m), weight 170.099 kg (375 lb), last menstrual period 10/14/2010, SpO2 99.00%. Maternal Exam:  Uterine Assessment: Contraction strength is mild.  Contraction duration is 1 minute. Contraction frequency is regular.   Abdomen: Patient reports no abdominal tenderness.   Physical Exam  Constitutional: She is oriented to person, place, and time. She appears well-developed and  well-nourished.  HENT:  Head: Normocephalic and atraumatic.  Eyes: Conjunctivae and EOM are normal. Pupils are equal, round, and reactive to light.  Neck: Normal range of motion. Neck supple.  Cardiovascular:       Rate 118 bmp.  Respiratory: Effort normal and breath sounds normal.  GI: Soft.  Musculoskeletal: Normal range of motion.  Neurological: She is alert and oriented to person, place, and time. She has normal reflexes.  Skin: Skin is warm and dry.  Psychiatric: She has a normal mood and affect. Her behavior is normal. Judgment and thought content normal.    Prenatal labs: ABO, Rh:   Antibody:   Rubella:   RPR:    HBsAg:    HIV:    GBS:     Assessment/Plan: 33 weeks.  SVT.  Cardioverted at [facility] ER.  UC's.  Transferred to AICU for tocolysis and observation.   Eudora Guevarra A 05/27/2011, 9:23 AM

## 2011-05-27 NOTE — ED Notes (Signed)
Patient c/o nausea  MD aware order recd

## 2011-05-27 NOTE — Progress Notes (Signed)
UR chart review completed.  

## 2011-05-27 NOTE — ED Notes (Signed)
Patient has been having contractions approx every 5 mins Has been on the baby monitor

## 2011-05-27 NOTE — ED Provider Notes (Signed)
History     CSN: [id number]  Arrival date & time 05/27/11  7829   First MD Initiated Contact with Patient 05/27/11 0518      Chief Complaint  Patient presents with  . Tachycardia    (Consider location/radiation/quality/duration/timing/severity/associated sxs/prior treatment) HPI Comments: Patient is a 36 year old female G4 P3 at [redacted] weeks gestation with an uncomplicated pregnancy who presents with a complaint of rapid heartbeat. According to the patient she woke up approximately 4 AM of feeling like her heart was racing and shortness. She states that this has been a persistent feeling, nothing makes it better or worse. She had similar symptoms during a prior pregnancy and was told to hold her breath and air down if this happened again. She tried this at home without improvement. She denies fevers chills cough back pain abdominal pain chest pain swelling in the legs dysuria diarrhea rash or any other complaints of. Prior to going to sleep last night she had no complaints.  She denies alcohol use, drug use, increased caffeine or other stimulant use. Symptoms are persistent, severe, EMS found tachycardic to approximately 160 on a rhythm strip, no medication given prior to arrival as the patient was hemodynamically stable  The history is provided by the patient, the EMS personnel and medical records.    Past Medical History  Diagnosis Date  . PSVT (paroxysmal supraventricular tachycardia)     History reviewed. No pertinent past surgical history.  History reviewed. No pertinent family history.  History  Substance Use Topics  . Smoking status: Never Smoker   . Smokeless tobacco: Not on file  . Alcohol Use: No    OB History    Grav Para Term Preterm Abortions TAB SAB Ect Mult Living   4 3 3  0 0 0 0 0 0 3      Review of Systems  All other systems reviewed and are negative.    Allergies  Review of patient's allergies indicates no known allergies.  Home Medications   Current  Outpatient Rx  Name Route Sig Dispense Refill  . PRENATAL VITAMINS PO Oral Take 1 tablet by mouth daily.       BP 126/57  Pulse 118  Temp(Src) 98.8 F (37.1 C) (Oral)  Resp 13  Ht 5\' 8"  (1.727 m)  Wt 375 lb (170.099 kg)  BMI 57.02 kg/m2  SpO2 99%  LMP 10/14/2010  Physical Exam  Nursing note and vitals reviewed. Constitutional: She appears well-developed and well-nourished. No distress.  HENT:  Head: Normocephalic and atraumatic.  Mouth/Throat: Oropharynx is clear and moist. No oropharyngeal exudate.  Eyes: Conjunctivae and EOM are normal. Pupils are equal, round, and reactive to light. Right eye exhibits no discharge. Left eye exhibits no discharge. No scleral icterus.  Neck: Normal range of motion. Neck supple. No JVD present. No thyromegaly present.  Cardiovascular: Regular rhythm, normal heart sounds and intact distal pulses.  Exam reveals no gallop and no friction rub.   No murmur heard.      Regular tachycardia approximately 170  Pulmonary/Chest: Effort normal and breath sounds normal. No respiratory distress. She has no wheezes. She has no rales.  Abdominal: Soft. Bowel sounds are normal. She exhibits distension ( Gravid abdomen, morbidly obese). She exhibits no mass. There is no tenderness.  Musculoskeletal: Normal range of motion. She exhibits no edema and no tenderness.  Lymphadenopathy:    She has no cervical adenopathy.  Neurological: She is alert. Coordination normal.  Skin: Skin is warm and dry. No rash noted.  No erythema.  Psychiatric: She has a normal mood and affect. Her behavior is normal.    ED Course  Procedures (including critical care time)  Labs Reviewed  CBC - Abnormal; Notable for the following:    RBC 3.80 (*)    Hemoglobin 8.6 (*)    HCT 28.4 (*)    MCV 74.7 (*)    MCH 22.6 (*)    RDW 16.1 (*)    All other components within normal limits  BASIC METABOLIC PANEL - Abnormal; Notable for the following:    CO2 17 (*)    Glucose, Bld 146 (*)     BUN 4 (*)    Creatinine, Ser 0.41 (*)    All other components within normal limits  URINALYSIS, ROUTINE W REFLEX MICROSCOPIC - Abnormal; Notable for the following:    Color, Urine STRAW (*)    Specific Gravity, Urine 1.002 (*)    Hgb urine dipstick TRACE (*)    All other components within normal limits  URINE MICROSCOPIC-ADD ON - Abnormal; Notable for the following:    Squamous Epithelial / LPF FEW (*)    All other components within normal limits  DIFFERENTIAL  POCT I-STAT TROPONIN I  I-STAT TROPONIN I   No results found.   1. Preterm labor   2. PSVT (paroxysmal supraventricular tachycardia)       MDM  Patient appears to be in a AVN RT supraventricular tachycardia. I have discussed her care with the cardiologist on call who is at the bedside to help with resuscitation. I have discussed care with the pharmacist who agrees that adenosine is an appropriate medication in this case. I proceeded with 6 mg IV push of adenosine intravenously with successful conversion from supraventricular tachycardia to a sinus tachycardia. She feels much better, has remained hemodynamically stable with a blood pressure of 140/70.  REview of MEDICAL RECORD NUMBERPt was seen by Dr. Eden Emms in 2009 after having recurrent PSVT - Echo at that time was normal.  At this time the OB/GYN nurse is at the bedside assisting with fetal monitoring and further evaluation at this time.  ED ECG REPORT   Date: 05/27/2011 1610RU  Rate: 166  Rhythm: supraventricular tachycardia (SVT)  QRS Axis: normal  Intervals: normal  ST/T Wave abnormalities: nonspecific T wave changes  Conduction Disutrbances:none  Narrative Interpretation:   Old EKG Reviewed: Compared with EKG from 10/15/2007, sinus tachycardia present at 130 beats per minute  ED ECG REPORT   Date: 05/27/2011 0539 AM  Rate: 132  Rhythm: sinus tachycardia  QRS Axis: normal  Intervals: normal  ST/T Wave abnormalities: normal  Conduction Disutrbances:none   Narrative Interpretation:   Old EKG Reviewed: SVT has resolved, sinus tachycardia has replaced rhythm  The patient has continued to improve as her blood pressure has remained stable and her heart rate has gradually decreased to the point where she currently has a pulse of 105. She is free of any symptoms at this time and has not had a recurrent arrhythmia.  Cardiologist on call this morning requested that the patient have a cardiology followup as an outpatient after delivery of the child for possible electrophysiology studies as she may have a reentrant tachycardia but needs ablation. I discussed this with the patient and she is expressed her understanding.  OB/GYN nurses have evaluated the patient and found that she is in fact in preterm labor with contractions every 2-6 minutes. Due to her size and morbid obesity it is difficult to detect every contractions on  average it is estimated approximately every 5 minutes. Care has been discussed with Dr. Tamela Oddi who is aware of the patient but change of shift at this time will require discussion with Dr. Clearance Coots.  At this time, the pt appears stable for transfer.  Labs show that she has mild anemia - not overly concerning at this stage of pregnancy.  CRITICAL CARE Performed by: Vida Roller   Total critical care time: 35  Critical care time was exclusive of separately billable procedures and treating other patients.  Critical care was necessary to treat or prevent imminent or life-threatening deterioration.  Critical care was time spent personally by me on the following activities: development of treatment plan with patient and/or surrogate as well as nursing, discussions with consultants, evaluation of patient's response to treatment, examination of patient, obtaining history from patient or surrogate, ordering and performing treatments and interventions, ordering and review of laboratory studies, ordering and review of radiographic studies,  pulse oximetry and re-evaluation of patient's condition.    Vida Roller, MD 05/27/11 [phone]

## 2011-05-27 NOTE — ED Notes (Signed)
Nurse in to check cervix  Patient tolerated

## 2011-05-27 NOTE — ED Notes (Signed)
NS bolus infusing.

## 2011-05-27 NOTE — Consult Note (Signed)
Reason for Consult: SVT Referring Physician: Dr Mary Fry is an 36 y.o. female who is currently 7 months pregnant with her second child and has a history of paroxysmal SVT. HPI: Patient woke up at about 4 am this morning with palpitations. She denies chest pain, lightheadedness or shortness of breath. She has no leg edema, no orthopnea or PND.  Cardiac history is significant for palpitations since she was a teenager. They have very abrupt onset and offsets like a light switch. Episodes last for 3 minutes at a time and she holds her breath or bears down to terminate them. Worst episode occurred 3 years ago in pregnancy, she presented to a hospital where she was given a medication that instantaneously terminated the palpitations. She has not had any bad episodes since then. This time around, bearing down did not help. EKG in the ER significant for a regular narrow complex short RP tachycardia at about 167b/m. She received 6 mg of adenosine in the ER with observed termination of SVT into sinus tachycardia which then slowly resolved.  Past Medical History  Diagnosis Date  . PSVT (paroxysmal supraventricular tachycardia)     History reviewed. No pertinent past surgical history.  History reviewed. No pertinent family history.  Social History:  reports that she has never smoked. She does not have any smokeless tobacco history on file. She reports that she does not drink alcohol or use illicit drugs.  Allergies: No Known Allergies  Medications: I have reviewed the patient's current medications.  Results for orders placed during the hospital encounter of 05/27/11 (from the past 48 hour(s))  URINALYSIS, ROUTINE W REFLEX MICROSCOPIC     Status: Abnormal   Collection Time   05/27/11  5:52 AM      Component Value Range Comment   Color, Urine STRAW (*) YELLOW     APPearance CLEAR  CLEAR     Specific Gravity, Urine 1.002 (*) 1.005 - 1.030     pH 7.0  5.0 - 8.0     Glucose, UA  NEGATIVE  NEGATIVE (mg/dL)    Hgb urine dipstick TRACE (*) NEGATIVE     Bilirubin Urine NEGATIVE  NEGATIVE     Ketones, ur NEGATIVE  NEGATIVE (mg/dL)    Protein, ur NEGATIVE  NEGATIVE (mg/dL)    Urobilinogen, UA 0.2  0.0 - 1.0 (mg/dL)    Nitrite NEGATIVE  NEGATIVE     Leukocytes, UA NEGATIVE  NEGATIVE    URINE MICROSCOPIC-ADD ON     Status: Abnormal   Collection Time   05/27/11  5:52 AM      Component Value Range Comment   Squamous Epithelial / LPF FEW (*) RARE     WBC, UA 0-2  <3 (WBC/hpf)    RBC / HPF 0-2  <3 (RBC/hpf)    Bacteria, UA RARE  RARE    POCT I-STAT TROPONIN I     Status: Normal   Collection Time   05/27/11  5:56 AM      Component Value Range Comment   Troponin i, poc 0.00  0.00 - 0.08 (ng/mL)    Comment 3              No results found.  Review of Systems  Constitutional: Negative.   HENT: Negative.   Eyes: Negative.   Respiratory: Negative.   Cardiovascular: Positive for palpitations. Negative for chest pain, orthopnea, claudication, leg swelling and PND.  Gastrointestinal:       ? Contractions  Genitourinary: Negative.  Musculoskeletal: Negative.   Skin: Negative.   Neurological: Negative.   Endo/Heme/Allergies: Negative.   Psychiatric/Behavioral: Negative.    Blood pressure 140/70, pulse 176, temperature 98.4 F (36.9 C), temperature source Oral, resp. rate 24, height 5\' 8"  (1.727 m), weight 375 lb (170.099 kg), last menstrual period 10/14/2010, SpO2 99.00%. Physical Exam  Constitutional: She is oriented to person, place, and time. No distress.       Morbidly Obese  HENT:  Mouth/Throat: No oropharyngeal exudate.  Eyes: No scleral icterus.  Neck: Normal range of motion. No JVD present.  Cardiovascular: Regular rhythm, S1 normal and S2 normal.  Tachycardia present.  PMI is not displaced.  Exam reveals no distant heart sounds and no friction rub.   Murmur heard.  Systolic murmur is present with a grade of 1/6  Respiratory: Effort normal and breath  sounds normal.  GI: She exhibits distension.  Musculoskeletal: Normal range of motion. She exhibits no edema and no tenderness.  Neurological: She is alert and oriented to person, place, and time.  Skin: Skin is warm and dry. No rash noted. No erythema. No pallor.  Psychiatric: Her behavior is normal. Judgment and thought content normal.       Anxious    EKG Regular short RP narrow complex tachycardia at 167b/m. Same terminated into sinus tachycardia with 6mg  of adenosine.  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   Mary Fry 05/27/2011, 6:16 AM

## 2011-05-27 NOTE — ED Notes (Signed)
Patient states she felt like "gas" moving around in her chest.  Also stated she felt pressure in her abd

## 2011-05-27 NOTE — Progress Notes (Signed)
Report received from Hessie Diener, RN.  Arrived to find pt resting comfortably s/p Adenosine cardioversion.  MHR 110-118, regular rhythm, sinus tach.  RR 24.  BP WNL.   O2 sat 97% on RA.  FHTs 155, mod varability, 10  X 10 accels, occasional mild variables.  UCs q 5-7 min, palpating mildelaxed resting tone.  Pt states PS 3/10 with UCs.    G4P3 (3 previous term C/S) at 33.5 GA.   NKDA.  Meds : PNV 1 PO daily.    Surgeries:  C/S x 3, bilateral underarm cystectomies.    Med hx:  Anemia, previous h/o SVT successfuly Adenosine cardioverted with normal cardiac follow up in 2010, migraines, morbid obesity.    Ob hx:  GDM with third pregnancy, failed 1 hour GTT/passed 3 hr GTT this pregnancy, SVT with third pregnancy, SVT current pregnancy successfully cardioverted with Adenosine 05/27/11, LGA this pregnancy per pt.  Dr Clearance Coots contacted via phone.  Pt's presence, status, VS, presenting complaint, SVT with Adenosine cardioversion, UCs, FHT, PS, med/ob hx, prior surgical hx discussed with Dr Clearance Coots, at length.  Orders obtained.  Dr Hyacinth Meeker notified of Dr Verdell Carmine request to transfer pt to [facility].

## 2011-05-28 NOTE — Progress Notes (Signed)
MD made aware of 15x15  X 1 otherwise reassurring FHR.  Per MD leave pt on the monitor until she leaves if no decels, pt may be d/c home as previously written.

## 2011-05-28 NOTE — Discharge Summary (Signed)
Physician Discharge Summary  Patient ID: Mary Fry MRN: [id number] DOB/AGE: 07-27-1975 36 y.o.  Admit date: 05/27/2011 Discharge date: 05/28/2011  Admission Diagnoses:  Discharge Diagnoses:  Principal Problem:  *PSVT (paroxysmal supraventricular tachycardia)   Discharged Condition: good  Hospital Course: Adenosine Cardioversion done at [facility] ER.  Transferred to [facility] for observation and treatment of UC's.  Magnesium sulfate tocolysis instituted.  Consults: cardiology  Significant Diagnostic Studies: labs: CBC, CMET, TSH  Treatments: IV hydration, cardiac meds: Adenosine, procedures: EFM Discharge Exam: Blood pressure 148/68, pulse 115, temperature 98.4 F (36.9 C), temperature source Oral, resp. rate 20, height 5\' 8"  (1.727 m), weight 170.099 kg (375 lb), last menstrual period 10/14/2010, SpO2 98.00%.   Disposition:   Discharge home.  F/U in office for regularly scheduled appt.  Discharge Orders    Future Appointments: Provider: Department: Dept Phone: Center:   06/06/2011 10:00 AM [facility] [place] [facility] [phone] [facility]     Future Orders Please Complete By Expires   Discharge instructions      Comments:   Routine   PRETERM LABOR:  Includes any of the follwing symptoms that occur between 20 - [redacted] weeks gestation.  If these symptoms are not stopped, preterm labor can result in preterm delivery, placing your baby at risk      Notify physician for menstrual like cramps      Notify physician for uterine contractions.  These may be painless and feel like the uterus is tightening or the baby is  "balling up"      Notify physician for low, dull backache, unrelieved by heat or Tylenol      Notify physician for intestinal cramps, with or without diarrhea, sometimes described as "gas pain"      Notify physician for pelvic pressure      Notify physician for increase or change in vaginal discharge      Notify physician for vaginal bleeding      Notify physician for a  general feeling that "something is not right"      Notify physician for leaking of fluid      Fetal Kick Count:  Lie on our left side for one hour after a meal, and count the number of times your baby kicks.  If it is less than 5 times, get up, move around and drink some juice.  Repeat the test 30 minutes later.  If it is still less than 5 kicks in an hour, notify your doctor.      Discharge activity:      Discharge diet:      Do not have sex or do anything that might make you have an orgasm      Discharge patient        Medication List  As of 05/28/2011  9:47 AM   TAKE these medications         PRENATAL VITAMINS PO   Take 1 tablet by mouth daily.           Follow-up Information    Schedule an appointment as soon as possible for a visit with Roseanna Rainbow, MD.   Contact information:   [address] [place] [address] [phone]          Signed: Brock Bad 05/28/2011, 9:47 AM

## 2011-05-28 NOTE — Discharge Summary (Signed)
Pt verbalizes understanding 

## 2011-05-28 NOTE — Progress Notes (Signed)
Patient ID: Orville Widmann, female   DOB: 10-06-1975, 36 y.o.   MRN: [id number] S: Preterm labor symptoms: UC's and chest palpations.  O: Blood pressure 148/68, pulse 115, temperature 98.4 F (36.9 C), temperature source Oral, resp. rate 20, height 5\' 8"  (1.727 m), weight 170.099 kg (375 lb), last menstrual period 10/14/2010, SpO2 98.00%.   FHT:  150 bpm. Toco: occ. UC's  WUJ:[id number]:  (unable to assess d/t posterior nature) Effacement (%): 50 Cervical Position: Posterior Station: Ballotable Presentation: Undeterminable Exam by:: Winifred Olive  A/P- 36 y.o. admitted with chest palpatations. Preterm labor management: bedrest advised and pelvic rest advised Dating:  [redacted]w[redacted]d PNL Needed:  none FWB:  good PTL:  stable

## 2011-05-28 NOTE — Discharge Summary (Signed)
Pt verbalizes understanding of all instructions.

## 2011-06-06 ENCOUNTER — Ambulatory Visit (HOSPITAL_COMMUNITY)
Admission: RE | Admit: 2011-06-06 | Discharge: 2011-06-06 | Disposition: A | Discharge status: Home / Self Care | Payer: Medicaid Other | Source: Ambulatory Visit | Attending: Obstetrics & Gynecology | Admitting: Obstetrics & Gynecology

## 2011-06-06 DIAGNOSIS — O09529 Supervision of elderly multigravida, unspecified trimester: Secondary | ICD-10-CM | POA: Insufficient documentation

## 2011-06-06 DIAGNOSIS — O34219 Maternal care for unspecified type scar from previous cesarean delivery: Secondary | ICD-10-CM | POA: Insufficient documentation

## 2011-06-06 DIAGNOSIS — E669 Obesity, unspecified: Secondary | ICD-10-CM | POA: Insufficient documentation

## 2011-06-06 DIAGNOSIS — O99210 Obesity complicating pregnancy, unspecified trimester: Secondary | ICD-10-CM | POA: Insufficient documentation

## 2011-06-22 ENCOUNTER — Encounter (HOSPITAL_COMMUNITY): Payer: Self-pay | Admitting: *Deleted

## 2011-06-22 ENCOUNTER — Inpatient Hospital Stay (HOSPITAL_COMMUNITY)
Admission: AD | Admit: 2011-06-22 | Discharge: 2011-06-23 | Disposition: A | Discharge status: Home / Self Care | Payer: Medicaid Other | Source: Ambulatory Visit | Attending: Obstetrics | Admitting: Obstetrics

## 2011-06-22 ENCOUNTER — Inpatient Hospital Stay (HOSPITAL_COMMUNITY): Payer: Medicaid Other

## 2011-06-22 DIAGNOSIS — O479 False labor, unspecified: Secondary | ICD-10-CM | POA: Insufficient documentation

## 2011-06-22 HISTORY — DX: Gestational diabetes mellitus in pregnancy, unspecified control: O24.419

## 2011-06-22 NOTE — Progress Notes (Signed)
Pt states since yesterday she has pain and pressure in her lower abd that comes and goes

## 2011-06-23 NOTE — Discharge Instructions (Signed)

## 2011-06-30 ENCOUNTER — Encounter (HOSPITAL_COMMUNITY): Payer: Self-pay

## 2011-06-30 ENCOUNTER — Encounter (HOSPITAL_COMMUNITY)
Admission: RE | Admit: 2011-06-30 | Discharge: 2011-06-30 | Disposition: A | Discharge status: Home / Self Care | Payer: Medicaid Other | Source: Ambulatory Visit | Attending: Obstetrics & Gynecology | Admitting: Obstetrics & Gynecology

## 2011-06-30 ENCOUNTER — Inpatient Hospital Stay (HOSPITAL_COMMUNITY): Admission: RE | Admit: 2011-06-30 | Payer: Medicaid Other | Source: Ambulatory Visit

## 2011-06-30 ENCOUNTER — Encounter (HOSPITAL_COMMUNITY): Payer: Self-pay | Admitting: *Deleted

## 2011-06-30 LAB — BASIC METABOLIC PANEL
BUN: 4 mg/dL — ABNORMAL LOW (ref 6–23)
Calcium: 9.3 mg/dL (ref 8.4–10.5)
Creatinine, Ser: 0.46 mg/dL — ABNORMAL LOW (ref 0.50–1.10)
GFR calc non Af Amer: >90 mL/min (ref >90)
Glucose, Bld: 99 mg/dL (ref 70–99)
Sodium: 133 mEq/L — ABNORMAL LOW (ref 135–145)

## 2011-06-30 LAB — CBC
MCH: 23.1 pg — ABNORMAL LOW (ref 26.0–34.0)
MCV: 75.6 fL — ABNORMAL LOW (ref 78.0–100.0)
Platelets: 216 10*3/uL (ref 150–400)
RDW: 16.4 % — ABNORMAL HIGH (ref 11.5–15.5)
WBC: 8.8 10*3/uL (ref 4.0–10.5)

## 2011-06-30 LAB — RAPID HIV SCREEN (WH-MAU): Rapid HIV Screen: NONREACTIVE

## 2011-06-30 NOTE — Pre-Procedure Instructions (Signed)
Reviewed patient's history and medicines with Dr Rodman Pickle.  Patient was cardioverted in 05/2011 for SVT, no heart meds, patient to see Cardiologist after delivery (per patient).  Ok to see patient DOS per Dr Rodman Pickle.  EKG on chart from 05/2011.

## 2011-06-30 NOTE — Patient Instructions (Addendum)
   Your procedure is scheduled on: Friday, March 1st  Enter through the Main Entrance of [facility] at: 11:30  Pick up the phone at the desk and dial [phone] and inform us of your arrival.  Please call this number if you have any problems the morning of surgery: [phone]  Remember: Do not eat food after midnight: Thursday Do not drink clear liquids after: 9am Friday Take these medicines the morning of surgery with a SIP OF WATER: Iron pill and prenatals prior to 9am.  Patient to withhold glyburide oral med on Thursday night.    Do not wear jewelry, make-up, or FINGER nail polish Do not wear lotions, powders, perfumes or deodorant. Do not shave 48 hours prior to surgery. Do not bring valuables to the hospital.  Leave suitcase in the car. After Surgery it may be brought to your room. For patients being admitted to the hospital, checkout time is 11:00am the day of discharge. Home with Sister Sarahmarie Leavey  cell [phone]  Patients discharged on the day of surgery will not be allowed to drive home.     Remember to use your hibiclens as instructed.Please shower with 1/2 bottle the evening before your surgery and the other 1/2 bottle the morning of surgery.

## 2011-07-03 ENCOUNTER — Encounter (HOSPITAL_COMMUNITY): Payer: Self-pay | Admitting: Obstetrics & Gynecology

## 2011-07-03 DIAGNOSIS — O34219 Maternal care for unspecified type scar from previous cesarean delivery: Secondary | ICD-10-CM | POA: Diagnosis present

## 2011-07-03 DIAGNOSIS — D509 Iron deficiency anemia, unspecified: Secondary | ICD-10-CM | POA: Diagnosis present

## 2011-07-03 DIAGNOSIS — O24419 Gestational diabetes mellitus in pregnancy, unspecified control: Secondary | ICD-10-CM | POA: Diagnosis present

## 2011-07-03 MED ORDER — CEFAZOLIN SODIUM-DEXTROSE 2-3 GM-% IV SOLR
2.0000 g | INTRAVENOUS | Status: AC
  Administered 2011-07-04: 2 g via INTRAVENOUS
  Filled 2011-07-03: qty 50

## 2011-07-03 NOTE — H&P (Signed)
Mary Fry is a 36 y.o. female presenting for an elective, repeat C/D. Maternal Medical History:  Reason for admission: For an elective C/D.  Fetal activity: Perceived fetal activity is normal.    Prenatal Complications - Diabetes: gestational. Diabetes is managed by oral agent (monotherapy).      OB History    Grav Para Term Preterm Abortions TAB SAB Ect Mult Living   4 3 3  0 0 0 0 0 0 3     Past Medical History  Diagnosis Date  . PSVT (paroxysmal supraventricular tachycardia)   . SVT (supraventricular tachycardia)     Adenosine caroversion 05/27/11, previous episode 2010  . Anemia     on iron pills  . Gestational diabetes     on oral meds  . Migraines     OTC med PRN    Past Surgical History  Procedure Date  . Cesarean section     x 3 at term  . Cystectomy 1990's    bilateral underarms   Family History: family history is negative for Anesthesia problems, and Hypotension, and Malignant hyperthermia, and Pseudochol deficiency, . Social History:  reports that she has never smoked. She has never used smokeless tobacco. She reports that she does not drink alcohol or use illicit drugs.  Review of Systems  Constitutional: Negative for fever.  Eyes: Negative for blurred vision.  Respiratory: Negative for shortness of breath.   Gastrointestinal: Negative for vomiting.  Skin: Negative for rash.  Neurological: Negative for headaches.      Last menstrual period 10/14/2010. Maternal Exam:  Abdomen: Patient reports no abdominal tenderness. Surgical scars: supraumbilical midline.   Introitus: not evaluated.     Fetal Exam Fetal Monitor Review: Mode: hand-held doppler probe.       Physical Exam  Constitutional: She appears well-developed.  HENT:  Head: Normocephalic.  Neck: Neck supple. No thyromegaly present.  Cardiovascular: Normal rate and regular rhythm.   Respiratory: Breath sounds normal.  GI: Soft. Bowel sounds are normal.  Skin: No rash noted.      Prenatal labs: ABO, Rh: --/--/O POS (02/25 1114) Antibody:   Rubella: Immune (08/29 0000) RPR: NON REACTIVE (02/25 1114)  HBsAg: Negative (08/29 0000)  HIV: Non-reactive (08/29 0000)  GBS:     Assessment/Plan: 36 y.o. multipara w/an IUP @ [redacted]w[redacted]d.  Pregnancy complicated by GDM--oral agent, U/S suspect for an LGA fetus. H/O of previous C/D x 3.  Morbid obesity/iron deficiency anema.  Admit Repeat C/D   Fry,Mary Mullet A 07/03/2011, 6:11 PM

## 2011-07-04 ENCOUNTER — Encounter (HOSPITAL_COMMUNITY)
Admission: RE | Disposition: A | Discharge status: Home Health | Payer: Self-pay | Source: Ambulatory Visit | Attending: Obstetrics & Gynecology

## 2011-07-04 ENCOUNTER — Inpatient Hospital Stay (HOSPITAL_COMMUNITY): Payer: Medicaid Other | Admitting: Anesthesiology

## 2011-07-04 ENCOUNTER — Encounter (HOSPITAL_COMMUNITY): Payer: Self-pay | Admitting: Anesthesiology

## 2011-07-04 ENCOUNTER — Encounter (HOSPITAL_COMMUNITY): Payer: Self-pay | Admitting: *Deleted

## 2011-07-04 ENCOUNTER — Inpatient Hospital Stay (HOSPITAL_COMMUNITY)
Admission: RE | Admit: 2011-07-04 | Discharge: 2011-07-06 | DRG: 766 | Disposition: A | Discharge status: Home Health | Payer: Medicaid Other | Source: Ambulatory Visit | Attending: Obstetrics & Gynecology | Admitting: Obstetrics & Gynecology

## 2011-07-04 DIAGNOSIS — E669 Obesity, unspecified: Secondary | ICD-10-CM | POA: Diagnosis present

## 2011-07-04 DIAGNOSIS — Z01818 Encounter for other preprocedural examination: Secondary | ICD-10-CM

## 2011-07-04 DIAGNOSIS — O3660X0 Maternal care for excessive fetal growth, unspecified trimester, not applicable or unspecified: Secondary | ICD-10-CM | POA: Diagnosis present

## 2011-07-04 DIAGNOSIS — O34219 Maternal care for unspecified type scar from previous cesarean delivery: Principal | ICD-10-CM | POA: Diagnosis present

## 2011-07-04 DIAGNOSIS — D509 Iron deficiency anemia, unspecified: Secondary | ICD-10-CM | POA: Diagnosis present

## 2011-07-04 DIAGNOSIS — O9902 Anemia complicating childbirth: Secondary | ICD-10-CM | POA: Diagnosis present

## 2011-07-04 DIAGNOSIS — O99814 Abnormal glucose complicating childbirth: Secondary | ICD-10-CM | POA: Diagnosis present

## 2011-07-04 DIAGNOSIS — Z01812 Encounter for preprocedural laboratory examination: Secondary | ICD-10-CM

## 2011-07-04 DIAGNOSIS — O09529 Supervision of elderly multigravida, unspecified trimester: Secondary | ICD-10-CM | POA: Diagnosis present

## 2011-07-04 DIAGNOSIS — O24419 Gestational diabetes mellitus in pregnancy, unspecified control: Secondary | ICD-10-CM | POA: Diagnosis present

## 2011-07-04 LAB — TYPE AND SCREEN: ABO/RH(D): O POS

## 2011-07-04 LAB — GLUCOSE, CAPILLARY: Glucose-Capillary: 107 mg/dL — ABNORMAL HIGH (ref 70–99)

## 2011-07-04 SURGERY — Surgical Case
Anesthesia: Epidural | Site: Uterus | Wound class: Clean Contaminated

## 2011-07-04 MED ORDER — ONDANSETRON HCL 4 MG/2ML IJ SOLN: 4.0000 mg | INTRAMUSCULAR | Status: DC | PRN

## 2011-07-04 MED ORDER — PHENYLEPHRINE 40 MCG/ML (10ML) SYRINGE FOR IV PUSH (FOR BLOOD PRESSURE SUPPORT)
PREFILLED_SYRINGE | INTRAVENOUS | Status: AC
  Filled 2011-07-04: qty 5

## 2011-07-04 MED ORDER — IBUPROFEN 600 MG PO TABS
600.0000 mg | ORAL_TABLET | Freq: Four times a day (QID) | ORAL | Status: DC
  Administered 2011-07-05 – 2011-07-06 (×7): 600 mg via ORAL
  Filled 2011-07-04: qty 1

## 2011-07-04 MED ORDER — KETOROLAC TROMETHAMINE 60 MG/2ML IM SOLN
INTRAMUSCULAR | Status: AC
  Administered 2011-07-04: 60 mg via INTRAMUSCULAR
  Filled 2011-07-04: qty 2

## 2011-07-04 MED ORDER — MEASLES, MUMPS & RUBELLA VAC ~~LOC~~ INJ: 0.5000 mL | INJECTION | Freq: Once | SUBCUTANEOUS | Status: DC

## 2011-07-04 MED ORDER — PHENYLEPHRINE 40 MCG/ML (10ML) SYRINGE FOR IV PUSH (FOR BLOOD PRESSURE SUPPORT)
PREFILLED_SYRINGE | INTRAVENOUS | Status: AC
  Filled 2011-07-04: qty 10

## 2011-07-04 MED ORDER — WITCH HAZEL-GLYCERIN EX PADS: 1.0000 "application " | MEDICATED_PAD | CUTANEOUS | Status: DC | PRN

## 2011-07-04 MED ORDER — ONDANSETRON HCL 4 MG/2ML IJ SOLN
INTRAMUSCULAR | Status: DC | PRN
  Administered 2011-07-04: 4 mg via INTRAVENOUS

## 2011-07-04 MED ORDER — MIDAZOLAM HCL 5 MG/5ML IJ SOLN
INTRAMUSCULAR | Status: DC | PRN
  Administered 2011-07-04: 1 mg via INTRAVENOUS

## 2011-07-04 MED ORDER — FENTANYL CITRATE 0.05 MG/ML IJ SOLN
INTRAMUSCULAR | Status: AC
  Administered 2011-07-04: 50 ug via INTRAVENOUS
  Filled 2011-07-04: qty 2

## 2011-07-04 MED ORDER — LACTATED RINGERS IV SOLN
INTRAVENOUS | Status: DC
  Administered 2011-07-04: 22:00:00 via INTRAVENOUS

## 2011-07-04 MED ORDER — NALOXONE HCL 0.4 MG/ML IJ SOLN: 1.0000 ug/kg/h | INTRAMUSCULAR | Status: DC | PRN

## 2011-07-04 MED ORDER — METOCLOPRAMIDE HCL 5 MG/ML IJ SOLN: 10.0000 mg | Freq: Three times a day (TID) | INTRAMUSCULAR | Status: DC | PRN

## 2011-07-04 MED ORDER — OXYTOCIN 20 UNITS IN LACTATED RINGERS INFUSION - SIMPLE
125.0000 mL/h | INTRAVENOUS | Status: AC
  Administered 2011-07-04: 125 mL/h via INTRAVENOUS

## 2011-07-04 MED ORDER — MAGNESIUM HYDROXIDE 400 MG/5ML PO SUSP: 30.0000 mL | ORAL | Status: DC | PRN

## 2011-07-04 MED ORDER — LIDOCAINE-EPINEPHRINE (PF) 2 %-1:200000 IJ SOLN
INTRAMUSCULAR | Status: AC
  Filled 2011-07-04: qty 20

## 2011-07-04 MED ORDER — SIMETHICONE 80 MG PO CHEW
80.0000 mg | CHEWABLE_TABLET | ORAL | Status: DC | PRN
  Administered 2011-07-05 (×3): 80 mg via ORAL

## 2011-07-04 MED ORDER — FENTANYL CITRATE 0.05 MG/ML IJ SOLN
25.0000 ug | INTRAMUSCULAR | Status: DC | PRN
  Administered 2011-07-04 (×2): 50 ug via INTRAVENOUS

## 2011-07-04 MED ORDER — SCOPOLAMINE 1 MG/3DAYS TD PT72
1.0000 | MEDICATED_PATCH | Freq: Once | TRANSDERMAL | Status: DC
  Administered 2011-07-04: 1.5 mg via TRANSDERMAL

## 2011-07-04 MED ORDER — OXYTOCIN 20 UNITS IN LACTATED RINGERS INFUSION - SIMPLE
INTRAVENOUS | Status: AC
  Administered 2011-07-04: 125 mL/h via INTRAVENOUS
  Filled 2011-07-04: qty 1000

## 2011-07-04 MED ORDER — FENTANYL CITRATE 0.05 MG/ML IJ SOLN
25.0000 ug | INTRAMUSCULAR | Status: DC | PRN
  Administered 2011-07-04: 25 ug via INTRAVENOUS

## 2011-07-04 MED ORDER — NALBUPHINE HCL 10 MG/ML IJ SOLN: 5.0000 mg | INTRAMUSCULAR | Status: DC | PRN

## 2011-07-04 MED ORDER — FENTANYL CITRATE 0.05 MG/ML IJ SOLN
INTRAMUSCULAR | Status: AC
  Filled 2011-07-04: qty 2

## 2011-07-04 MED ORDER — SCOPOLAMINE 1 MG/3DAYS TD PT72: 1.0000 | MEDICATED_PATCH | Freq: Once | TRANSDERMAL | Status: DC

## 2011-07-04 MED ORDER — KETOROLAC TROMETHAMINE 30 MG/ML IJ SOLN: 30.0000 mg | Freq: Four times a day (QID) | INTRAMUSCULAR | Status: AC | PRN

## 2011-07-04 MED ORDER — SODIUM BICARBONATE 8.4 % IV SOLN
INTRAVENOUS | Status: AC
  Filled 2011-07-04: qty 50

## 2011-07-04 MED ORDER — TETANUS-DIPHTH-ACELL PERTUSSIS 5-2.5-18.5 LF-MCG/0.5 IM SUSP: 0.5000 mL | Freq: Once | INTRAMUSCULAR | Status: DC

## 2011-07-04 MED ORDER — DIPHENHYDRAMINE HCL 25 MG PO CAPS: 25.0000 mg | ORAL_CAPSULE | Freq: Four times a day (QID) | ORAL | Status: DC | PRN

## 2011-07-04 MED ORDER — FENTANYL CITRATE 0.05 MG/ML IJ SOLN
INTRAMUSCULAR | Status: DC | PRN
  Administered 2011-07-04 (×2): 100 ug via INTRAVENOUS

## 2011-07-04 MED ORDER — LIDOCAINE IN DEXTROSE 5-7.5 % IV SOLN: INTRAVENOUS | Status: DC | PRN

## 2011-07-04 MED ORDER — ZOLPIDEM TARTRATE 5 MG PO TABS: 5.0000 mg | ORAL_TABLET | Freq: Every evening | ORAL | Status: DC | PRN

## 2011-07-04 MED ORDER — LACTATED RINGERS IV SOLN
INTRAVENOUS | Status: DC
  Administered 2011-07-04 (×3): via INTRAVENOUS

## 2011-07-04 MED ORDER — MIDAZOLAM HCL 2 MG/2ML IJ SOLN
INTRAMUSCULAR | Status: AC
  Filled 2011-07-04: qty 2

## 2011-07-04 MED ORDER — OXYCODONE-ACETAMINOPHEN 5-325 MG PO TABS
1.0000 | ORAL_TABLET | ORAL | Status: DC | PRN
  Administered 2011-07-05: 1 via ORAL
  Filled 2011-07-04: qty 1

## 2011-07-04 MED ORDER — PRENATAL MULTIVITAMIN CH
1.0000 | ORAL_TABLET | Freq: Every day | ORAL | Status: DC
  Administered 2011-07-05 – 2011-07-06 (×2): 1 via ORAL
  Filled 2011-07-04 (×2): qty 1

## 2011-07-04 MED ORDER — KETOROLAC TROMETHAMINE 60 MG/2ML IM SOLN
60.0000 mg | Freq: Once | INTRAMUSCULAR | Status: AC | PRN
  Administered 2011-07-04: 60 mg via INTRAMUSCULAR

## 2011-07-04 MED ORDER — ONDANSETRON HCL 4 MG/2ML IJ SOLN
INTRAMUSCULAR | Status: AC
  Filled 2011-07-04: qty 2

## 2011-07-04 MED ORDER — SODIUM BICARBONATE 8.4 % IV SOLN
INTRAVENOUS | Status: DC | PRN
  Administered 2011-07-04: 2 mL via EPIDURAL

## 2011-07-04 MED ORDER — TRIAMCINOLONE ACETONIDE 40 MG/ML IJ SUSP
Freq: Once | INTRAMUSCULAR | Status: AC
  Administered 2011-07-04: 14:00:00 via INTRAMUSCULAR
  Filled 2011-07-04 (×2): qty 1

## 2011-07-04 MED ORDER — DIPHENHYDRAMINE HCL 50 MG/ML IJ SOLN: 12.5000 mg | INTRAMUSCULAR | Status: DC | PRN

## 2011-07-04 MED ORDER — OXYTOCIN 20 UNITS IN LACTATED RINGERS INFUSION - SIMPLE
INTRAVENOUS | Status: DC | PRN
  Administered 2011-07-04: 20 [IU] via INTRAVENOUS

## 2011-07-04 MED ORDER — LANOLIN HYDROUS EX OINT: 1.0000 "application " | TOPICAL_OINTMENT | CUTANEOUS | Status: DC | PRN

## 2011-07-04 MED ORDER — MEPERIDINE HCL 25 MG/ML IJ SOLN: 6.2500 mg | INTRAMUSCULAR | Status: DC | PRN

## 2011-07-04 MED ORDER — MORPHINE SULFATE 0.5 MG/ML IJ SOLN
INTRAMUSCULAR | Status: AC
  Filled 2011-07-04: qty 10

## 2011-07-04 MED ORDER — SODIUM CHLORIDE 0.9 % IJ SOLN: 3.0000 mL | INTRAMUSCULAR | Status: DC | PRN

## 2011-07-04 MED ORDER — ONDANSETRON HCL 4 MG/2ML IJ SOLN: 4.0000 mg | Freq: Three times a day (TID) | INTRAMUSCULAR | Status: DC | PRN

## 2011-07-04 MED ORDER — FERROUS SULFATE 325 (65 FE) MG PO TABS
325.0000 mg | ORAL_TABLET | Freq: Two times a day (BID) | ORAL | Status: DC
  Administered 2011-07-05 – 2011-07-06 (×3): 325 mg via ORAL
  Filled 2011-07-04 (×3): qty 1

## 2011-07-04 MED ORDER — DIBUCAINE 1 % RE OINT: 1.0000 "application " | TOPICAL_OINTMENT | RECTAL | Status: DC | PRN

## 2011-07-04 MED ORDER — PHENYLEPHRINE HCL 10 MG/ML IJ SOLN
INTRAMUSCULAR | Status: DC | PRN
  Administered 2011-07-04 (×2): 80 ug via INTRAVENOUS
  Administered 2011-07-04 (×2): 40 ug via INTRAVENOUS
  Administered 2011-07-04: 80 ug via INTRAVENOUS
  Administered 2011-07-04 (×2): 40 ug via INTRAVENOUS
  Administered 2011-07-04 (×6): 80 ug via INTRAVENOUS
  Administered 2011-07-04: 40 ug via INTRAVENOUS
  Administered 2011-07-04 (×5): 80 ug via INTRAVENOUS

## 2011-07-04 MED ORDER — DIPHENHYDRAMINE HCL 25 MG PO CAPS: 25.0000 mg | ORAL_CAPSULE | ORAL | Status: DC | PRN

## 2011-07-04 MED ORDER — IBUPROFEN 600 MG PO TABS
600.0000 mg | ORAL_TABLET | Freq: Four times a day (QID) | ORAL | Status: DC | PRN
  Filled 2011-07-04 (×6): qty 1

## 2011-07-04 MED ORDER — OXYTOCIN 10 UNIT/ML IJ SOLN
INTRAMUSCULAR | Status: AC
  Filled 2011-07-04: qty 2

## 2011-07-04 MED ORDER — DIPHENHYDRAMINE HCL 50 MG/ML IJ SOLN: 25.0000 mg | INTRAMUSCULAR | Status: DC | PRN

## 2011-07-04 MED ORDER — SENNOSIDES-DOCUSATE SODIUM 8.6-50 MG PO TABS
2.0000 | ORAL_TABLET | Freq: Every day | ORAL | Status: DC
  Administered 2011-07-05: 2 via ORAL

## 2011-07-04 MED ORDER — NALOXONE HCL 0.4 MG/ML IJ SOLN: 0.4000 mg | INTRAMUSCULAR | Status: DC | PRN

## 2011-07-04 MED ORDER — ONDANSETRON HCL 4 MG PO TABS: 4.0000 mg | ORAL_TABLET | ORAL | Status: DC | PRN

## 2011-07-04 SURGICAL SUPPLY — 32 items
APL SKNCLS STERI-STRIP NONHPOA (GAUZE/BANDAGES/DRESSINGS) ×1
BENZOIN TINCTURE PRP APPL 2/3 (GAUZE/BANDAGES/DRESSINGS) ×2 IMPLANT
BRR ADH 6X5 SEPRAFILM 1 SHT (MISCELLANEOUS) ×1
CHLORAPREP W/TINT 26ML (MISCELLANEOUS) ×3 IMPLANT
CLOTH BEACON ORANGE TIMEOUT ST (SAFETY) ×2 IMPLANT
CONTAINER PREFILL 10% NBF 15ML (MISCELLANEOUS) IMPLANT
DRESSING TELFA 8X3 (GAUZE/BANDAGES/DRESSINGS) ×2 IMPLANT
ELECT REM PT RETURN 9FT ADLT (ELECTROSURGICAL) ×2
ELECTRODE REM PT RTRN 9FT ADLT (ELECTROSURGICAL) ×1 IMPLANT
GAUZE SPONGE 4X4 12PLY STRL LF (GAUZE/BANDAGES/DRESSINGS) ×3 IMPLANT
GLOVE BIO SURGEON STRL SZ 6.5 (GLOVE) ×4 IMPLANT
GOWN PREVENTION PLUS LG XLONG (DISPOSABLE) ×6 IMPLANT
NEEDLE HYPO 22GX1.5 SAFETY (NEEDLE) ×1 IMPLANT
NS IRRIG 1000ML POUR BTL (IV SOLUTION) ×2 IMPLANT
PACK C SECTION WH (CUSTOM PROCEDURE TRAY) ×2 IMPLANT
PAD ABD 7.5X8 STRL (GAUZE/BANDAGES/DRESSINGS) ×2 IMPLANT
SEPRAFILM MEMBRANE 5X6 (MISCELLANEOUS) ×1 IMPLANT
STAPLER VISISTAT 35W (STAPLE) ×1 IMPLANT
SUT MNCRL 0 VIOLET CTX 36 (SUTURE) ×2 IMPLANT
SUT MONOCRYL 0 CTX 36 (SUTURE) ×5
SUT PDS AB 0 CTX 36 PDP370T (SUTURE) ×2 IMPLANT
SUT PDS AB 0 CTX 60 (SUTURE) ×2 IMPLANT
SUT PLAIN 0 NONE (SUTURE) ×1 IMPLANT
SUT PLAIN 2 0 XLH (SUTURE) ×1 IMPLANT
SUT VIC AB 2-0 CT1 27 (SUTURE) ×2
SUT VIC AB 2-0 CT1 TAPERPNT 27 (SUTURE) ×1 IMPLANT
SYR BULB IRRIGATION 50ML (SYRINGE) ×1 IMPLANT
SYR CONTROL 10ML LL (SYRINGE) ×1 IMPLANT
TOWEL OR 17X24 6PK STRL BLUE (TOWEL DISPOSABLE) ×4 IMPLANT
TRAY FOLEY CATH 14FR (SET/KITS/TRAYS/PACK) ×2 IMPLANT
WATER STERILE IRR 1000ML POUR (IV SOLUTION) ×2 IMPLANT
YANKAUER SUCT BULB TIP NO VENT (SUCTIONS) ×2 IMPLANT

## 2011-07-04 NOTE — Transfer of Care (Signed)
Immediate Anesthesia Transfer of Care Note  Patient: Mary Fry  Procedure(s) Performed: Procedure(s) (LRB): CESAREAN SECTION (N/A)  Patient Location: PACU  Anesthesia Type: Spinal  Level of Consciousness: awake, alert  and oriented  Airway & Oxygen Therapy: Patient Spontanous Breathing  Post-op Assessment: Report given to PACU RN and Post -op Vital signs reviewed and stable  Post vital signs: Reviewed and stable  Complications: No apparent anesthesia complications

## 2011-07-04 NOTE — Anesthesia Procedure Notes (Signed)
Epidural Patient location during procedure: OB  Preanesthetic Checklist Completed: patient identified, site marked, surgical consent, pre-op evaluation, timeout performed, IV checked, risks and benefits discussed and monitors and equipment checked  Epidural Patient position: sitting Prep: site prepped and draped and DuraPrep Patient monitoring: continuous pulse ox and blood pressure Approach: midline Injection technique: LOR air  Needle:  Needle type: Tuohy  Needle gauge: 17 G Needle length: 9 cm Catheter type: closed end flexible Catheter size: 19 Gauge Catheter at skin depth: 11 cm Test dose: negative  Assessment Events: blood not aspirated, injection not painful, no injection resistance, negative IV test and paresthesia (with 1st pass of sprotte, not touhy; repositioned, + asp CSF on 2nd pass, no persistant parasthesia)  Additional Notes Spinal Dosage in OR  Bupivicaine ml       1.8 PFMS04   mcg        150  Catheter easily to 17 cm.  (-) asp T4 level

## 2011-07-04 NOTE — Op Note (Signed)
Cesarean Section Procedure Note   Mary Fry   07/04/2011  Indications: Scheduled Proceedure/Maternal Request   Pre-operative Diagnosis: IUP @ 39 weeks/ With history of previous C-Section. Desires a sterilization.  GDM-oral agent.  LGA fetus.  Morbid obesity.  Post-operative Diagnosis: Same   Surgeon: Antionette Char A  Assistants: Coral Ceo A  Anesthesia: epidural  Procedure Details:  The patient was seen in the Holding Room. The risks, benefits, complications, treatment options, and expected outcomes were discussed with the patient. The patient concurred with the proposed plan, giving informed consent. The patient was identified as Radiographer, therapeutic and the procedure verified as C-Section Delivery. A Time Out was held and the above information confirmed.  After induction of anesthesia, the patient was draped and prepped in the usual sterile manner. A midline, vertical, supraumbilical  incision was made and carried down through the subcutaneous tissue to the fascia. The fascial incision was made and extended transversely. The fascia was separated from the underlying rectus tissue superiorly and inferiorly. The peritoneum was identified and entered. The peritoneal incision was extended longitudinally. Omental adhesions to abdominal wall were divided with the Bovie.  The uterus was incised in vertical fashion starting at the fundus and extended down to the lower uterine segment. Delivered via a complete breech extraction  was a 9 lb 8 oz living newborn female infant. with Apgar scores of 7 at one minute and 8 at five minutes. A cord ph was not sent. The umbilical cord was clamped and cut cord. A sample was obtained for evaluation. The placenta was removed Intact and appeared normal.  The uterus was exteriorized.  The uterine incision was closed in layers with running locked sutures of 1-0 Monocryl. The serosa was closed in a baseball fashion using 2-0 Vicryl suture.  Hemostasis was  observed. The mid-isthmic portion of the left Fallopian tube was grasped with a Babcock clamp.  A 1 - 2 segment of tube was doubly ligated with free ligatures of O-Plain.  A window was made in the mesosalpinx of the mid-isthmic portion of the right Fallopian tube.  A 1 - 2 segment of tube was ligated in a [place] fashion.  The uterus was returned to the abdomen.  The paracolic gutters were irrigated. The fascia was then reapproximated with running sutures of 1-0 PDS. The subcutaneous layer was reaaproximated with a running suture of 2-0 Plain.  A dilute Kenalog/marcaine solution was injected into the skin/dermis.  The skin was closed with staples.   Instrument, sponge, and needle counts were correct prior the abdominal closure and were correct at the conclusion of the case.    Findings:  See above    Estimated Blood Loss: 600 ml  Total IV Fluids:  Per Anesthesiology  Urine Output: Per Anesthesiology  Specimens: None  Complications: no complications  Disposition: PACU - hemodynamically stable.  Maternal Condition: stable   Baby condition / location:  nursery-stable    Signed: Surgeon(s): Roseanna Rainbow, MD

## 2011-07-04 NOTE — Anesthesia Postprocedure Evaluation (Signed)
  Anesthesia Post-op Note  Patient: Mary Fry  Procedure(s) Performed: Procedure(s) (LRB): CESAREAN SECTION (N/A)  Patient is awake, responsive, moving her legs, and has signs of resolution of her numbness. Pain and nausea are reasonably well controlled. Vital signs are stable and clinically acceptable. Oxygen saturation is clinically acceptable. There are no apparent anesthetic complications at this time. Patient is ready for discharge.

## 2011-07-04 NOTE — Interval H&P Note (Signed)
History and Physical Interval Note:  07/04/2011 11:28 AM  Mary Fry  has presented today for surgery, with the diagnosis of IUP @ 39 WKS/ H/O PREVIOUS C-SECTION  The various methods of treatment have been discussed with the patient and family. After consideration of risks, benefits and other options for treatment, the patient has consented to  Procedure(s) (LRB): CESAREAN SECTION (N/A) as a surgical intervention .  The patients' history has been reviewed, patient examined, no change in status, stable for surgery.  I have reviewed the patients' chart and labs.  Questions were answered to the patient's satisfaction.     JACKSON-MOORE,Friend Dorfman A

## 2011-07-04 NOTE — Anesthesia Preprocedure Evaluation (Signed)
Anesthesia Evaluation  Patient identified by MRN, date of birth, ID band Patient awake    Reviewed: Allergy & Precautions, H&P , Patient's Chart, lab work & pertinent test results  Airway Mallampati: II TM Distance: >3 FB Neck ROM: full    Dental No notable dental hx.    Pulmonary  clear to auscultation  Pulmonary exam normal       Cardiovascular Exercise Tolerance: Good regular Normal    Neuro/Psych    GI/Hepatic   Endo/Other  Diabetes mellitus-Morbid obesity  Renal/GU      Musculoskeletal   Abdominal   Peds  Hematology   Anesthesia Other Findings   Reproductive/Obstetrics                           Anesthesia Physical Anesthesia Plan  ASA: III  Anesthesia Plan: Spinal and Epidural   Post-op Pain Management:    Induction:   Airway Management Planned:   Additional Equipment:   Intra-op Plan:   Post-operative Plan:   Informed Consent: I have reviewed the patients History and Physical, chart, labs and discussed the procedure including the risks, benefits and alternatives for the proposed anesthesia with the patient or authorized representative who has indicated his/her understanding and acceptance.   Dental Advisory Given  Plan Discussed with: CRNA  Anesthesia Plan Comments: (Lab work confirmed with CRNA in room. Platelets okay. Discussed spinal anesthetic, and patient consents to the procedure:  included risk of possible headache,backache, failed block, allergic reaction, and nerve injury. This patient was asked if she had any questions or concerns before the procedure started. )        Anesthesia Quick Evaluation

## 2011-07-05 LAB — HEMOGLOBIN: Hemoglobin: 7.6 g/dL — ABNORMAL LOW (ref 12.0–15.0)

## 2011-07-05 LAB — GLUCOSE, CAPILLARY: Glucose-Capillary: 115 mg/dL — ABNORMAL HIGH (ref 70–99)

## 2011-07-05 MED ORDER — GUAIFENESIN-DM 100-10 MG/5ML PO SYRP
5.0000 mL | ORAL_SOLUTION | ORAL | Status: DC | PRN
  Administered 2011-07-05 – 2011-07-06 (×4): 5 mL via ORAL
  Filled 2011-07-05 (×5): qty 5

## 2011-07-05 NOTE — Anesthesia Postprocedure Evaluation (Signed)
  Anesthesia Post-op Note  Patient: Radiographer, therapeutic  Procedure(s) Performed: Procedure(s) (LRB): CESAREAN SECTION (N/A)  Patient Location: Mother/Baby  Anesthesia Type: Spinal  Level of Consciousness: awake, alert  and oriented  Airway and Oxygen Therapy: Patient Spontanous Breathing  Post-op Pain: mild  Post-op Assessment: Patient's Cardiovascular Status Stable, Respiratory Function Stable, Patent Airway and Pain level controlled  Post-op Vital Signs: stable  Complications: No apparent anesthesia complications

## 2011-07-05 NOTE — Addendum Note (Signed)
Addendum  created 07/05/11 9604 by Lincoln Brigham, CRNA   Modules edited:Notes Section

## 2011-07-05 NOTE — Progress Notes (Signed)
Patient ID: Mary Fry, female   DOB: 1975-11-20, 36 y.o.   MRN: [id number] Subjective: POD# 1 s/p Cesarean Delivery.  Indications: scheduled  RH status/Rubella reviewed. Feeding: unknown Patient reports tolerating PO.  Denies HA/SOB/C/P/N/V/dizziness.  Reports flatus or BM. Breast symptoms: no.  She reports vaginal bleeding as normal, without clots.  She is ambulating, urinating without difficulty.     Objective: Vital signs in last 24 hours: BP 114/72  Pulse 97  Temp(Src) 97.9 F (36.6 C) (Oral)  Resp 20  Wt 170.099 kg (375 lb)  SpO2 100%  LMP 10/14/2010  Breastfeeding? Unknown   Total I/O In: -  Out: 550 [Urine:550]   Physical Exam:  General: alert CV: Regular rate and rhythm Resp: clear Abdomen: soft, nontender, normal bowel sounds Lochia: minimal Uterine Fundus: firm, below umbilicus, tender Incision: dressing C/D Ext: extremities normal, atraumatic, no cyanosis or edema    Basename 07/05/11 0530  HGB 7.6*  HCT --      Assessment/Plan: 36 y.o.  status post Cesarean section. POD# 1.   Doing well.   Anemia stable.           Advance diet as tolerated Start po pain meds D/C foley  HLIV  Ambulate IS Routine post-op care  JACKSON-MOORE,Lenah Messenger A 07/05/2011, 11:18 AM

## 2011-07-06 MED ORDER — OXYCODONE-ACETAMINOPHEN 5-325 MG PO TABS: 1.0000 | ORAL_TABLET | ORAL | Status: AC | PRN

## 2011-07-06 NOTE — Progress Notes (Signed)
Subjective: POD #2 s/p LTC/S  Indication:scheduled Patient reports tolerating PO.  Denies HA/SOB/C/P/N/V/dizziness.  Reports flatus or BM. Breast symptoms: no  She reports vaginal bleeding as normal, without clots.  She is ambulating, urinating without difficulty.     Objective: Vital signs in last 24 hours: BP 132/88  Pulse 106  Temp(Src) 98.5 F (36.9 C) (Oral)  Resp 20  Wt 170.099 kg (375 lb)  SpO2 100%  LMP 10/14/2010  Breastfeeding? Unknown  Physical Exam:  General: alert CV: Regular rate and rhythm Resp: clear Abdomen: soft, nontender, normal bowel sounds Lochia: minimal Uterine Fundus: firm, below umbilicus, nontender Incision: clean, dry and intact Ext: extremities normal, atraumatic, no cyanosis or edema  Basename 07/05/11 0530  HGB 7.6*  HCT --    Assessment/Plan: 36 y.o. status post Cesarean section POD# 2.  normal post-operative exam  Routine post-op care D/C home  JACKSON-MOORE,Jayma Volpi A 07/06/2011, 11:27 AM

## 2011-07-06 NOTE — Discharge Summary (Signed)
Obstetric Discharge Summary Reason for Admission: cesarean section Prenatal Procedures: none Intrapartum Procedures: cesarean: classical and tubal ligation Postpartum Procedures: none Complications-Operative and Postpartum: none Hemoglobin  Date Value Range Status  07/05/2011 7.6* 12.0-15.0 (g/dL) Final     HCT  Date Value Range Status  06/30/2011 29.8* 36.0-46.0 (%) Final    Discharge Diagnoses: Term Pregnancy-delivered  Discharge Information: Date: 07/06/2011 Activity: pelvic rest Diet: routine Medications: PNV, Ibuprofen, Iron and Percocet Condition: stable Instructions: see above Discharge to: home Follow-up Information    Follow up with Roseanna Rainbow, MD. Schedule an appointment as soon as possible for a visit on 07/11/2011.   Contact information:   [address], [address] [place] [address] [phone]          Newborn Data: Live born female  Birth Weight: 9 lb 8.2 oz (4315 g) APGAR: 7, 8  Home with mother.  JACKSON-MOORE,Nini Cavan A 07/06/2011, 11:32 AM

## 2011-07-07 ENCOUNTER — Encounter (HOSPITAL_COMMUNITY): Payer: Self-pay | Admitting: Obstetrics & Gynecology

## 2011-07-07 NOTE — Progress Notes (Signed)
UR chart review completed.  

## 2011-07-08 ENCOUNTER — Encounter (HOSPITAL_COMMUNITY): Payer: Self-pay | Admitting: *Deleted

## 2012-02-01 IMAGING — US US OB FOLLOW-UP
1 series · 14 of 28 positions shown · non-contrast
Comparison: none

[Series 1: us ob follow-up · 0.22mm/px · 58 acquisitions, 14 frames shown]
[im 3/58]
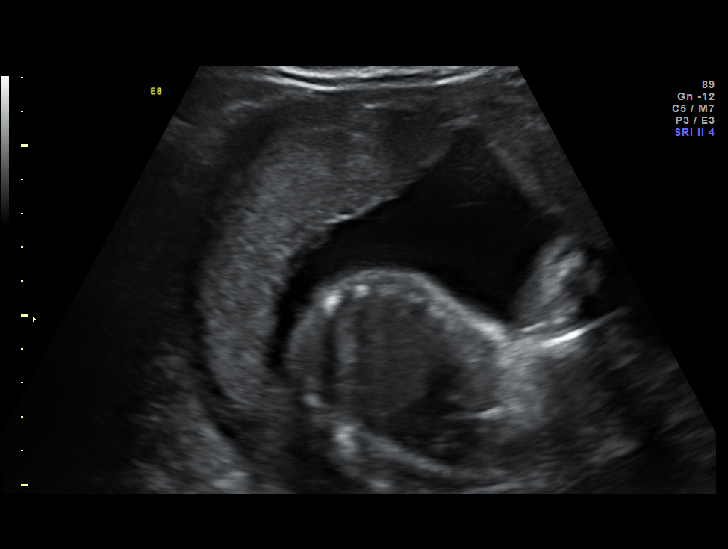
[im 7/58]
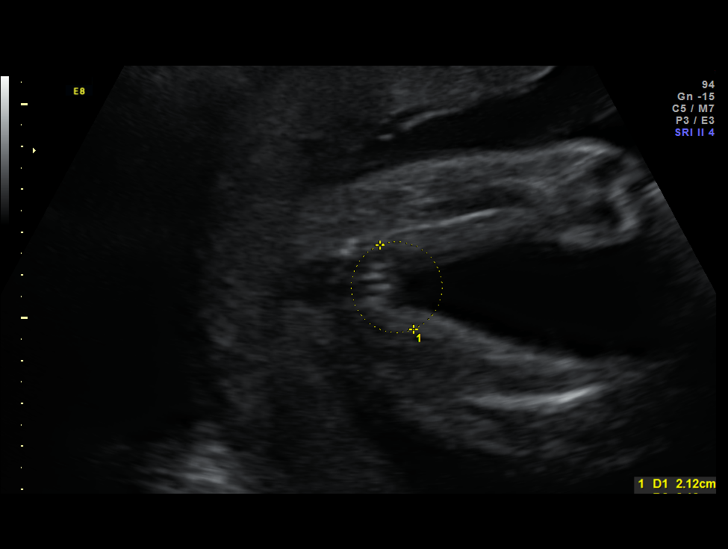
[im 11/58]
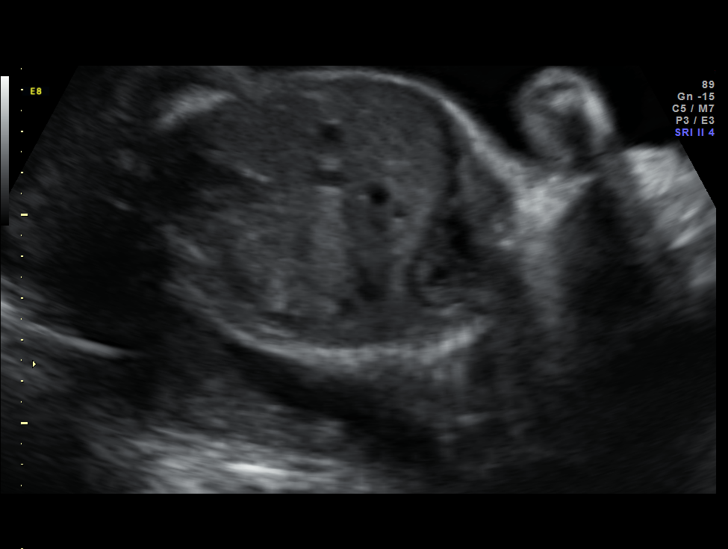
[im 15/58]
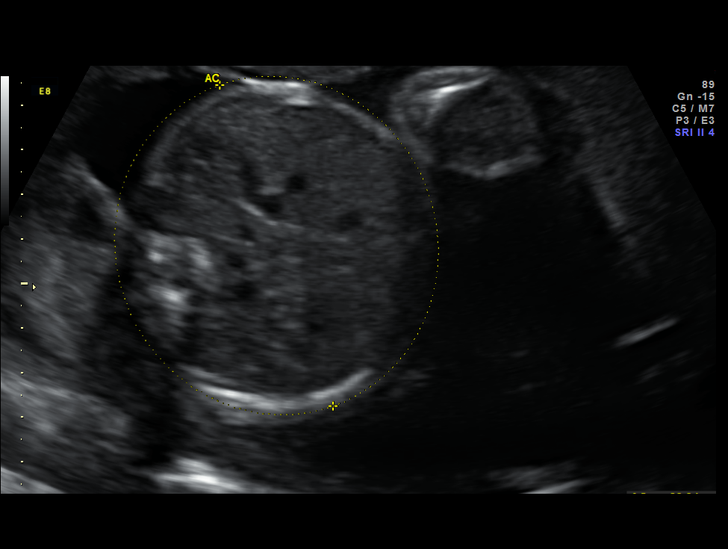
[im 20/58]
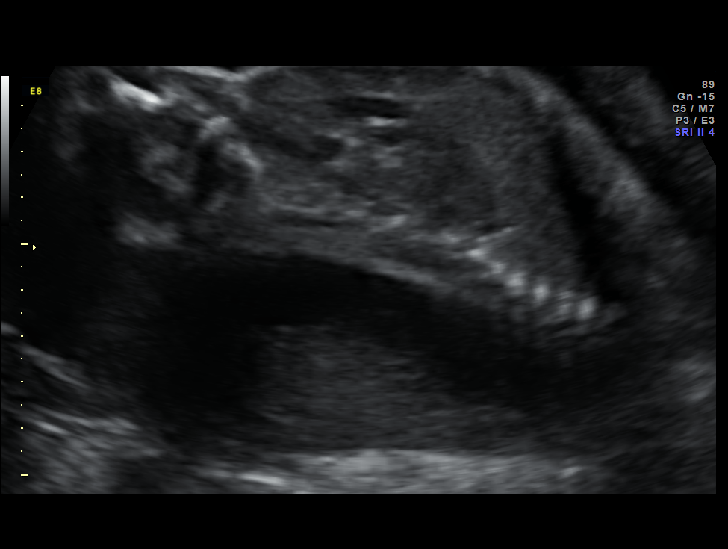
[im 24/58]
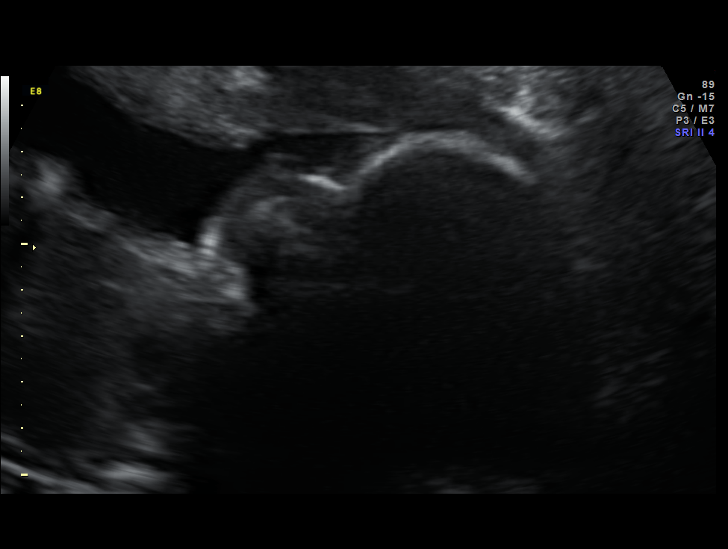
[im 28/58]
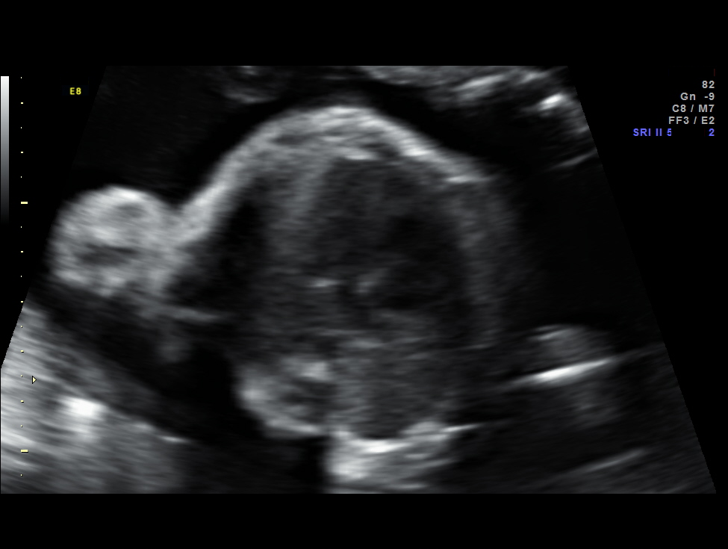
[im 32/58]
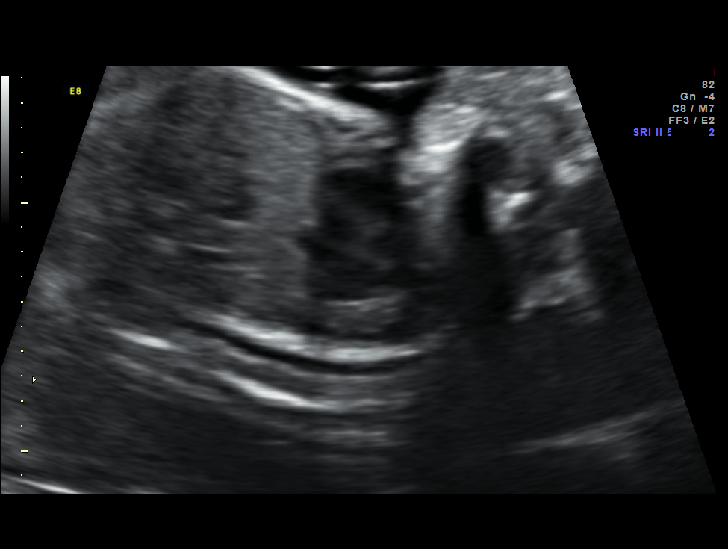
[im 36/58]
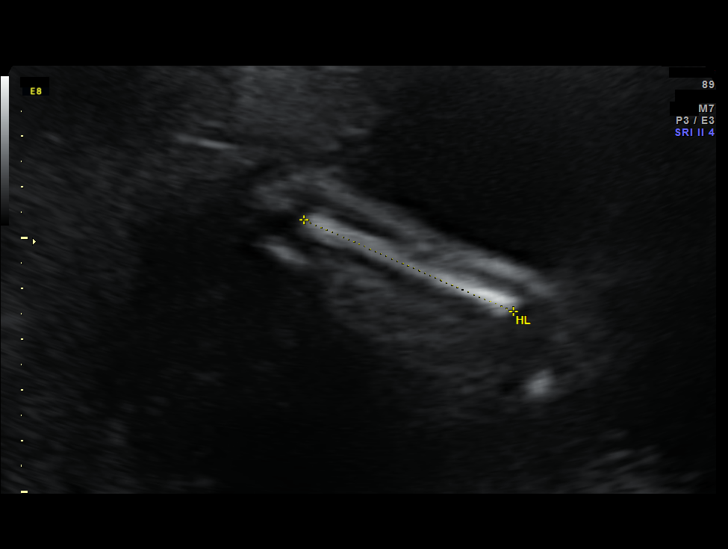
[im 41/58]
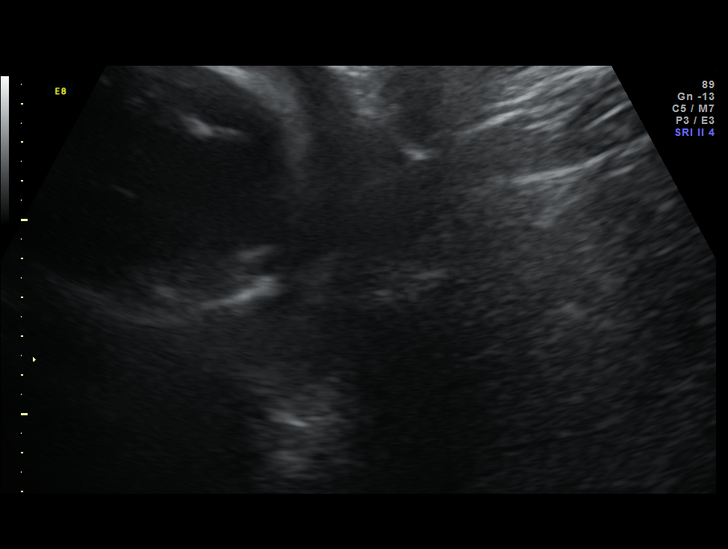
[im 45/58]
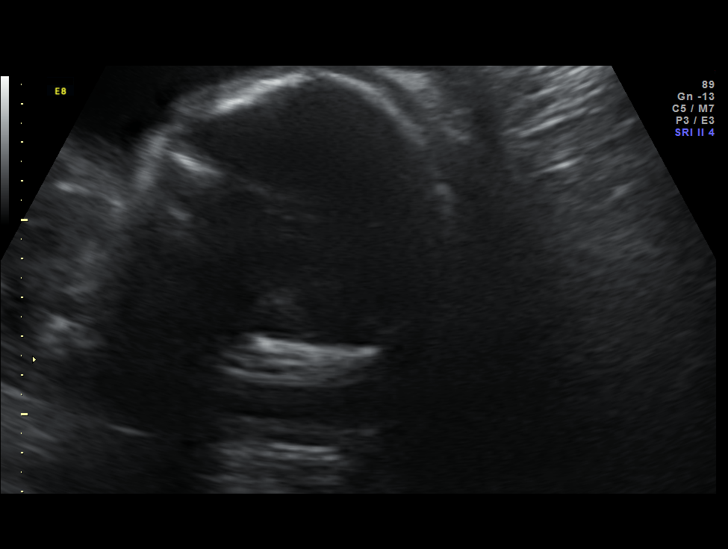
[im 49/58]
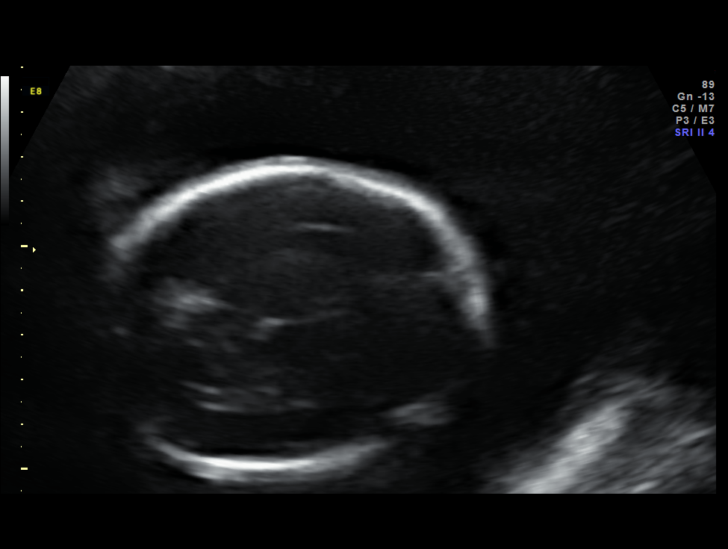
[im 53/58]
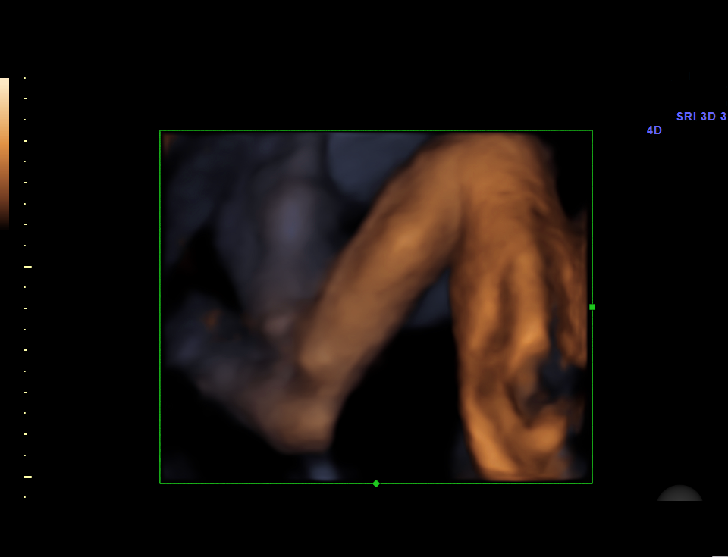
[im 58/58]
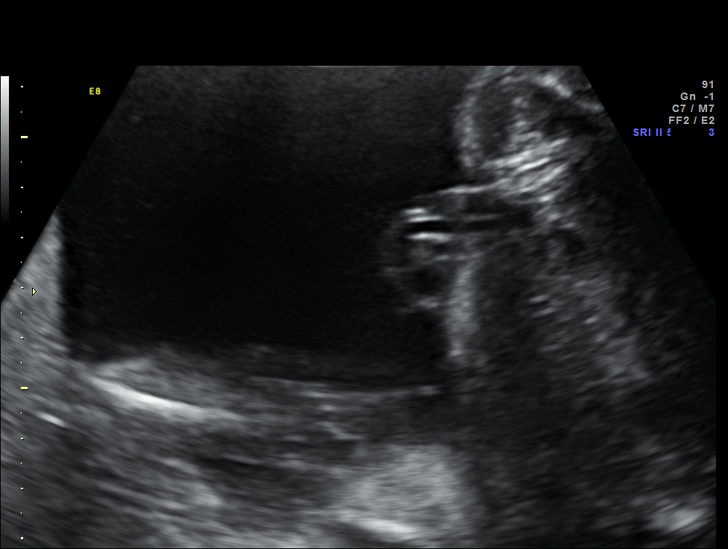

[14 of 28 positions shown; findings below may reference images not displayed]

Canned report from images found in remote index.

Refer to host system for actual result text.

## 2014-03-06 ENCOUNTER — Encounter (HOSPITAL_COMMUNITY): Payer: Self-pay | Admitting: *Deleted

## 2014-05-18 ENCOUNTER — Telehealth: Payer: Self-pay

## 2014-05-18 NOTE — Telephone Encounter (Signed)
MANUALLY FAXED PATIEN'TS IMMUNIZATION RECORDS, PER MEDICAL RELEASE TO [phone], [facility]
